# Patient Record
Sex: Female | Born: 1975 | Hispanic: No | Marital: Married | Smoking: Never smoker
Health system: Southern US, Community
[De-identification: ages and names within clinical notes are randomized; demographics above are authoritative.]

## PROBLEM LIST (undated history)

## (undated) DIAGNOSIS — K219 Gastro-esophageal reflux disease without esophagitis: Secondary | ICD-10-CM

## (undated) HISTORY — DX: Gastro-esophageal reflux disease without esophagitis: K21.9

---

## 2004-06-01 ENCOUNTER — Ambulatory Visit: Payer: Self-pay | Admitting: Family Medicine

## 2004-06-09 ENCOUNTER — Ambulatory Visit: Payer: Self-pay | Admitting: Family Medicine

## 2004-06-25 ENCOUNTER — Encounter (INDEPENDENT_AMBULATORY_CARE_PROVIDER_SITE_OTHER): Payer: Self-pay | Admitting: *Deleted

## 2004-06-25 LAB — CONVERTED CEMR LAB

## 2004-07-06 ENCOUNTER — Ambulatory Visit: Payer: Self-pay | Admitting: Family Medicine

## 2004-08-31 ENCOUNTER — Ambulatory Visit: Payer: Self-pay | Admitting: Family Medicine

## 2004-09-21 ENCOUNTER — Ambulatory Visit: Payer: Self-pay | Admitting: Family Medicine

## 2004-10-04 ENCOUNTER — Ambulatory Visit: Payer: Self-pay | Admitting: Sports Medicine

## 2004-10-27 ENCOUNTER — Ambulatory Visit: Payer: Self-pay | Admitting: Family Medicine

## 2004-11-16 ENCOUNTER — Ambulatory Visit: Payer: Self-pay | Admitting: Family Medicine

## 2004-11-23 ENCOUNTER — Ambulatory Visit: Payer: Self-pay | Admitting: Family Medicine

## 2004-12-06 ENCOUNTER — Ambulatory Visit: Payer: Self-pay | Admitting: Obstetrics and Gynecology

## 2004-12-06 ENCOUNTER — Inpatient Hospital Stay (HOSPITAL_COMMUNITY): Admission: AD | Admit: 2004-12-06 | Discharge: 2004-12-06 | Payer: Self-pay | Admitting: *Deleted

## 2004-12-07 ENCOUNTER — Inpatient Hospital Stay (HOSPITAL_COMMUNITY): Admission: AD | Admit: 2004-12-07 | Discharge: 2004-12-11 | Payer: Self-pay | Admitting: *Deleted

## 2004-12-07 ENCOUNTER — Ambulatory Visit: Payer: Self-pay | Admitting: Family Medicine

## 2004-12-07 ENCOUNTER — Ambulatory Visit: Payer: Self-pay | Admitting: Obstetrics and Gynecology

## 2004-12-08 ENCOUNTER — Encounter (INDEPENDENT_AMBULATORY_CARE_PROVIDER_SITE_OTHER): Payer: Self-pay | Admitting: *Deleted

## 2006-08-18 ENCOUNTER — Encounter (INDEPENDENT_AMBULATORY_CARE_PROVIDER_SITE_OTHER): Payer: Self-pay | Admitting: *Deleted

## 2008-12-18 ENCOUNTER — Encounter: Payer: Self-pay | Admitting: Family Medicine

## 2008-12-18 ENCOUNTER — Ambulatory Visit: Payer: Self-pay | Admitting: Family Medicine

## 2008-12-24 ENCOUNTER — Encounter: Payer: Self-pay | Admitting: Family Medicine

## 2008-12-24 DIAGNOSIS — O360990 Maternal care for other rhesus isoimmunization, unspecified trimester, not applicable or unspecified: Secondary | ICD-10-CM | POA: Insufficient documentation

## 2008-12-24 LAB — CONVERTED CEMR LAB
Antibody Screen: NEGATIVE
Basophils Absolute: 0 10*3/uL (ref 0.0–0.1)
Basophils Relative: 0 % (ref 0–1)
Eosinophils Absolute: 0.1 10*3/uL (ref 0.0–0.7)
Eosinophils Relative: 1 % (ref 0–5)
Hemoglobin: 13 g/dL (ref 12.0–15.0)
MCV: 89.8 fL (ref 78.0–100.0)
Monocytes Absolute: 0.4 10*3/uL (ref 0.1–1.0)
Neutrophils Relative %: 70 % (ref 43–77)
Platelets: 232 10*3/uL (ref 150–400)
RBC: 4.21 M/uL (ref 3.87–5.11)
Rubella: 109.7 intl units/mL — ABNORMAL HIGH
WBC: 8.9 10*3/uL (ref 4.0–10.5)

## 2008-12-25 ENCOUNTER — Encounter: Payer: Self-pay | Admitting: Family Medicine

## 2008-12-25 ENCOUNTER — Ambulatory Visit: Payer: Self-pay | Admitting: Family Medicine

## 2008-12-25 LAB — CONVERTED CEMR LAB
Chlamydia, DNA Probe: NEGATIVE
GC Probe Amp, Genital: NEGATIVE

## 2009-01-01 ENCOUNTER — Encounter: Payer: Self-pay | Admitting: Family Medicine

## 2009-01-01 DIAGNOSIS — O34219 Maternal care for unspecified type scar from previous cesarean delivery: Secondary | ICD-10-CM

## 2009-01-23 ENCOUNTER — Ambulatory Visit: Payer: Self-pay | Admitting: Family Medicine

## 2009-02-03 ENCOUNTER — Ambulatory Visit: Payer: Self-pay | Admitting: Family Medicine

## 2009-02-05 ENCOUNTER — Encounter: Payer: Self-pay | Admitting: Family Medicine

## 2009-02-26 ENCOUNTER — Ambulatory Visit: Payer: Self-pay | Admitting: Family Medicine

## 2009-03-03 ENCOUNTER — Encounter: Payer: Self-pay | Admitting: Family Medicine

## 2009-03-17 ENCOUNTER — Telehealth: Payer: Self-pay | Admitting: Family Medicine

## 2009-03-26 ENCOUNTER — Encounter: Payer: Self-pay | Admitting: Family Medicine

## 2009-03-26 ENCOUNTER — Ambulatory Visit: Payer: Self-pay | Admitting: Family Medicine

## 2009-03-26 DIAGNOSIS — Z87898 Personal history of other specified conditions: Secondary | ICD-10-CM

## 2009-05-01 ENCOUNTER — Encounter: Payer: Self-pay | Admitting: Family Medicine

## 2009-05-01 ENCOUNTER — Ambulatory Visit: Payer: Self-pay | Admitting: Family Medicine

## 2009-05-01 DIAGNOSIS — J309 Allergic rhinitis, unspecified: Secondary | ICD-10-CM | POA: Insufficient documentation

## 2009-05-01 LAB — CONVERTED CEMR LAB
Antibody Screen: NEGATIVE
HCT: 31.9 % — ABNORMAL LOW (ref 36.0–46.0)
Hemoglobin: 10.4 g/dL — ABNORMAL LOW (ref 12.0–15.0)
MCHC: 32.6 g/dL (ref 30.0–36.0)
MCV: 91.4 fL (ref 78.0–100.0)
Platelets: 238 10*3/uL (ref 150–400)
RBC: 3.49 M/uL — ABNORMAL LOW (ref 3.87–5.11)
RDW: 13.5 % (ref 11.5–15.5)
WBC: 9.4 10*3/uL (ref 4.0–10.5)

## 2009-05-12 ENCOUNTER — Ambulatory Visit: Payer: Self-pay | Admitting: Family Medicine

## 2009-05-18 ENCOUNTER — Ambulatory Visit: Payer: Self-pay | Admitting: Family Medicine

## 2009-05-18 ENCOUNTER — Encounter: Payer: Self-pay | Admitting: Family Medicine

## 2009-05-18 DIAGNOSIS — K644 Residual hemorrhoidal skin tags: Secondary | ICD-10-CM | POA: Insufficient documentation

## 2009-05-26 ENCOUNTER — Telehealth: Payer: Self-pay | Admitting: *Deleted

## 2009-05-26 ENCOUNTER — Ambulatory Visit: Payer: Self-pay | Admitting: Family Medicine

## 2009-06-01 ENCOUNTER — Encounter: Payer: Self-pay | Admitting: Family Medicine

## 2009-06-01 ENCOUNTER — Ambulatory Visit (HOSPITAL_COMMUNITY): Admission: RE | Admit: 2009-06-01 | Discharge: 2009-06-01 | Payer: Self-pay | Admitting: *Deleted

## 2009-06-10 ENCOUNTER — Ambulatory Visit: Payer: Self-pay | Admitting: Family Medicine

## 2009-06-18 ENCOUNTER — Encounter: Payer: Self-pay | Admitting: Family Medicine

## 2009-06-22 ENCOUNTER — Encounter: Payer: Self-pay | Admitting: Family Medicine

## 2009-06-24 ENCOUNTER — Ambulatory Visit: Payer: Self-pay | Admitting: Family Medicine

## 2009-06-24 ENCOUNTER — Encounter: Payer: Self-pay | Admitting: Family Medicine

## 2009-06-25 ENCOUNTER — Encounter: Payer: Self-pay | Admitting: Family Medicine

## 2009-06-26 ENCOUNTER — Inpatient Hospital Stay (HOSPITAL_COMMUNITY): Admission: AD | Admit: 2009-06-26 | Discharge: 2009-06-26 | Payer: Self-pay | Admitting: Obstetrics & Gynecology

## 2009-06-26 ENCOUNTER — Ambulatory Visit: Payer: Self-pay | Admitting: Obstetrics and Gynecology

## 2009-06-30 ENCOUNTER — Encounter: Payer: Self-pay | Admitting: Family Medicine

## 2009-07-01 ENCOUNTER — Ambulatory Visit (HOSPITAL_COMMUNITY): Admission: RE | Admit: 2009-07-01 | Discharge: 2009-07-01 | Payer: Self-pay | Admitting: Family Medicine

## 2009-07-01 ENCOUNTER — Encounter: Payer: Self-pay | Admitting: Family Medicine

## 2009-07-01 ENCOUNTER — Ambulatory Visit: Payer: Self-pay | Admitting: Family Medicine

## 2009-07-03 ENCOUNTER — Ambulatory Visit: Payer: Self-pay | Admitting: Family Medicine

## 2009-07-03 ENCOUNTER — Inpatient Hospital Stay (HOSPITAL_COMMUNITY): Admission: AD | Admit: 2009-07-03 | Discharge: 2009-07-07 | Payer: Self-pay | Admitting: Obstetrics and Gynecology

## 2009-07-04 ENCOUNTER — Encounter: Payer: Self-pay | Admitting: Family Medicine

## 2009-07-04 ENCOUNTER — Encounter: Payer: Self-pay | Admitting: Obstetrics and Gynecology

## 2009-07-06 ENCOUNTER — Encounter: Payer: Self-pay | Admitting: *Deleted

## 2009-07-09 ENCOUNTER — Encounter: Payer: Self-pay | Admitting: Family Medicine

## 2009-07-09 ENCOUNTER — Emergency Department (HOSPITAL_COMMUNITY): Admission: EM | Admit: 2009-07-09 | Discharge: 2009-07-09 | Payer: Self-pay | Admitting: Family Medicine

## 2010-07-20 NOTE — Assessment & Plan Note (Signed)
Summary: OB AND 1 HR GLUC/KH   Vital Signs:  Patient profile:   35 year old female Weight:      165 pounds BP sitting:   102 / 64  Vitals Entered By: [name] CMA, (May 01, 2009 2:08 PM)  Habits & Providers  Alcohol-Tobacco-Diet     Cigarette Packs/Day: n/a  Current Medications (verified): 1)  Prenatal Vitamins 0.8 Mg Tabs (Prenatal Multivit-Min-Fe-Fa) .Marland Kitchen.. 1 Tab By Mouth Daily.  Take At Night With Small Snack If They Upset Your Stomach. 2)  Acyclovir 400 Mg Tabs (Acyclovir) .Marland Kitchen.. 1 Tab Three Times A Day For 7 Days At First Sign of An Outbreak 3)  Rhinocort Aqua 32 Mcg/act Susp (Budesonide (Nasal)) .... One Spray in Each Nostril Twice Daily  Allergies (verified): No Known Drug Allergies   Impression & Recommendations:  Problem # 1:  SUPERVISION OF OTHER NORMAL PREGNANCY (ICD-V22.1) Assessment Unchanged Doing well today. 28 week labs drawn, including recheck of antibody screen prior to Rhogam injection. 1 hour glucola today = 133. Will defer decision for 3 hour GTT to PCP since this is so close to the cut-off of 135. She has no known risk factors for GDM. The patient has questions about a VBAC today. It seems to be very important to her to have a TOLAC, so we reviewed the need for Acyclovir at 35 weeks to lessen the chance of a herpes lesion. Will make appt with OB to okay TOLAC ([phone]). We discussed the patient's last Korea, showing a low lying placenta and the recommendation for a follow-up US. Reassured her that low lying placentas usually resolve as the pregnancy progresses, but a follow Korea is indicated. Cost is an issue for the patient. Gave information for [facility] and advised patient to get orange card ASAP so that we can send her to the [facility] for an [place] (will be free with orange card). This will also help her with MAU expenses.   EDC by LMP is 07/18/09 confirmed with 20 week Korea. US showing low lying placenta that needs follow up. Labs: A, Rh negative, antibody negative,  Hgb 10.4 (11/12), platelets 238 (11/12), RPR NR, HIV NR, Hep B negative, SS negative, Rubella Immune, Varicella Immune, GC/Chlamydia negative (7/8), PAP Negative, Urine Culture insignificant growth, Quad WNL, 1 hour glucola = 133. Baby = Female = Amar Back-up: Dr. Earlene Plater [phone])  Orders: Glucose 1 hr-[facility] [phone]) CBC-[facility] (82956) RPR-[facility] [id number]) HIV-[facility] ([id number]) Miscellaneous Lab Charge-[facility] (32440) Other OB visit- [facility] (OBCK)  Problem # 2:  RH FACTOR, NEGATIVE (ICD-656.10) Assessment: Unchanged RH antibody rechecked and Rhogam given.  Orders: Rhogam 300 mcg - IM ([id number]) Other OB visit- [facility] (OBCK)  Problem # 3:  GENITAL HERPES, HX OF (ICD-V13.8) Assessment: Unchanged  Has noticed a couple of lesions since her last visit, but has none today. Did not use the previously prescribed Acyclovir. Reviewed need for daily Acyclovir starting at 35 weeks to reduce the chance of having active lesions at time of delivery, which would necessitate a c-section.  Orders: Other OB visit- [facility] (OBCK)  Problem # 4:  PREVIOUS C-SECT DELIVERY ANTPRTM COND/COMP (NUU-725.36)  Will schedule appt at [facility] with OBs for VBAC approval. Prior LTCS due to FTP.  Orders: Other OB visit- [facility] (OBCK)  Problem # 5:  ALLERGIC RHINITIS (ICD-477.9) Assessment: New  The patient endorsed symptoms of allergic rhinitis prior to pregnancy, that has worsened with pregnancy. Likely component of pregnancy rhinitis, but will treat with intranasal glucocorticoid for allergy component. Rx Budesonise since  it is category B. Also recommended regular appropriate exercise, saline spray nasal irrigation. Note: patient endorses trouble affording medications, so gave information for the MAP at the HD. Her updated medication list for this problem includes:    Rhinocort Aqua 32 Mcg/act Susp (Budesonide (nasal)) ..... One spray in each nostril twice daily  Orders: Other OB visit- [facility] (OBCK)  Complete Medication List: 1)   Prenatal Vitamins 0.8 Mg Tabs (Prenatal multivit-min-fe-fa) .Marland Kitchen.. 1 tab by mouth daily.  take at night with small snack if they upset your stomach. 2)  Acyclovir 400 Mg Tabs (Acyclovir) .Marland Kitchen.. 1 tab three times a day for 7 days at first sign of an outbreak 3)  Rhinocort Aqua 32 Mcg/act Susp (Budesonide (nasal)) .... One spray in each nostril twice daily  Patient Instructions: 1)  It was very nice to meet you today! Follow up in 2 weeks with either Dr. Georgiana Shore or Dr. Earlene Plater. 2)  I have prescribed a medicine for your allergies. It may be expensive.  3)  I have given you information for the MEDICATION ASSISTANCE PROGRAM at the [facility] and [place] at [facility]. These will help you to pay for medications and tests.  4)  If you have contractions, bleeding or fluid from your vagina, or you do not feel the baby moving enough, please go to the [facility]. 5)  To feel the baby, sit in a quiet place with your feet up. If you feel the baby kick 5 times in 1 hour, you can stop. If not, feel for another hour. 10 kicks in 2 hours is enough. Do this once a day. 6)  We will schedule an appointment for you to talk about having a vaginal delivery. 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  Father of her first child.  Menstrual History    LMP (date): 10/11/2008    LMP -  Character: normal    Menarche: 14 years    Menses interval: 30 days    Menstrual flow 4 days    On BCP's at conception: no   Flowsheet View for Follow-up Visit    Estimated weeks of       gestation:     28 6/7    Weight:     165    Blood pressure:   102 / 64    Headache:     No    Nausea/vomiting:   No    Edema:     0    Vaginal bleeding:   no    Vaginal discharge:   no    Fundal height:      28    FHR:       150    Fetal activity:  yes    Labor symptoms:   no    Taking prenatal vits?   Y    Smoking:     n/a    Next visit:     2 wk    Resident:     Earlene Plater    Comment:     28 week labs, one hour glucola today. rhogam today. dx allergic rhinitis, claritin not helping.     Medication Administration  Injection # 1:    Medication: Rhogam 300 mcg - IM    Diagnosis: RH FACTOR, NEGATIVE (ICD-656.10)    Route: IM    Site: RUOQ gluteus    Exp Date: 10/23/2011    Lot #: [id number]    Mfr: csl behring    Patient tolerated injection without complications    Given by: [name] CMA, (May 01, 2009 3:43 PM)  Orders Added: 1)  Glucose 1 hr-[facility] [82950] 2)  CBC-[facility] [85027] 3)  RPR-[facility] [[id number]] 4)  HIV-[facility] [[id number]] 5)  Miscellaneous Lab Charge-[facility] [99999] 6)  Rhogam 300 mcg - IM [J2790] 7)  Other OB visit- [facility] [OBCK]   Flowsheet View for Follow-up Visit    Estimated weeks of       gestation:     28 6/7    Weight:     165    Blood pressure:   102 / 64    Hx headache?     No    Nausea/vomiting?   No    Edema?     0    Bleeding?     no    Leakage/discharge?   no    Fetal activity:       yes    Labor symptoms?   no    Fundal height:      28    FHR:       150    Taking Vitamins?   Y    Smoking PPD:   n/a    Comment:     28 week labs, one hour glucola today. rhogam today. dx allergic rhinitis, claritin not helping.     Next visit:     2 wk    Resident:     Earlene Plater

## 2010-07-20 NOTE — Assessment & Plan Note (Signed)
Summary: OB visit/eo   Vital Signs:  Patient profile:   35 year old female Height:      60.6 inches Weight:      168 pounds Pulse rate:   88 / minute BP sitting:   110 / 60  (left arm) Cuff size:   regular  Vitals Entered By: Tessie Fass, CMA CC: OB visit   CC:  OB visit.  Allergies: No Known Drug Allergies   Impression & Recommendations:  Problem # 1:  SUPERVISION OF OTHER NORMAL PREGNANCY (ICD-V22.1) Assessment Unchanged Doing well.  GBS obtained today.  Minimal amount of brown, mucusy discharge on vaginal check.  See pt instructions.  Return in 1 week.  Orders: Grp B Probe-[facility] ([id number]) Other OB visit- [facility] (OBCK)  Problem # 2:  RH FACTOR, NEGATIVE (ICD-656.10) Assessment: Unchanged Rhogam at delivery or at 40 wGA if undelivered.   Problem # 3:  GENITAL HERPES, HX OF (ICD-V13.8) Assessment: Unchanged On acyclovir prophylaxis.   Problem # 4:  PREVIOUS C-SECT DELIVERY ANTPRTM COND/COMP (UJW-119.14) Assessment: Unchanged Has appt with [facility] tomorrow to discuss VBAC.  Appt is later than usual due to 1) awaited ultrasound results to eval low-lying placenta nad had to wait until [facility] approve so patient could afford it.   Complete Medication List: 1)  Prenatal Vitamins 0.8 Mg Tabs (Prenatal multivit-min-fe-fa) .Marland Kitchen.. 1 tab by mouth daily.  take at night with small snack if they upset your stomach. 2)  Acyclovir 400 Mg Tabs (Acyclovir) .Marland Kitchen.. 1 tab three times a day until delivery of baby 3)  Rhinocort Aqua 32 Mcg/act Susp (Budesonide (nasal)) .... One spray in each nostril twice daily 4)  Anusol-hc 2.5 % Crea (Hydrocortisone) .... Apply to affected area three times a day as needed pain. dispense for 1 month 5)  Colace 100 Mg Caps (Docusate sodium) .Marland Kitchen.. 1 tab by mouth two times a day for  Patient Instructions: 1)  If you experience bright red bleeding or the baby isn't moving much, please go right to [facility]. 2)  If you start having contractions that last  about a minute, are coming every 5 minutes, for an hour or more, then go to [facility] hospital for evaluation. 3)  From here on out you will be seen every week.  Please schedule your next appointment with DR. Earlene Plater.  If her schedule is full, then come back to see me.  Try to see Dr. Earlene Plater again soon though as she will deliver you if I am unable to.    Flowsheet View for Follow-up Visit    Estimated weeks of       gestation:     36 4/7    Weight:     168    Blood pressure:   110 / 60    Hx headache?     No    Nausea/vomiting?   No    Edema?     0    Bleeding?     yes    Leakage/discharge?   no    Fetal activity:       yes    Labor symptoms?   no    Fundal height:      36.5    FHR:       150    Fetal position:      vertex    Cx dilation:     1    Cx effacement:   40%    Fetal station:     -2  Taking Vitamins?   Y    Comment:     dark brown spotting on underwear this AM    Next visit:     1 wk    Resident:     Georgiana Shore

## 2010-07-20 NOTE — Miscellaneous (Signed)
Summary: Records faxed to [facility]  Clinical Lists Changes  Faxed 3rd trimester info to [facility].

## 2010-07-20 NOTE — Miscellaneous (Signed)
Summary: TOLAC consultation  Clinical Lists Changes  Received note from [facility] clinics.  Pt had TOLAC consultation with OBs and chooses TOLAC.  Okay with OBs.   Ardeen Garland  MD  June 25, 2009 8:55 PM

## 2010-07-20 NOTE — Miscellaneous (Signed)
Summary: walk in  Clinical Lists Changes came in with baby for his weight check. states she has pain & burning when she urinates. no appt available. sent to UC. she agreed.Golden Circle RN  July 09, 2009 12:16 PM

## 2010-07-20 NOTE — Miscellaneous (Signed)
Summary: FMLA  pts husband dropped off FMLA paperwork to be completed for his job so that he may care for his wife when she delivers their son. Placed in triage for any clinical info to be completed. Knox Royalty  June 22, 2009 9:14 AM    to pcp chart box.Golden Circle RN  June 22, 2009 4:06 PM  completed.  placed in to be faxed box.  Patient also had appt today so gave her copy. Ardeen Garland  MD  June 24, 2009 10:52 AM

## 2010-07-20 NOTE — Letter (Signed)
Summary: FMLA  FMLA   Imported By: De Nurse 06/30/2009 14:03:26  _____________________________________________________________________  External Attachment:    Type:   Image     Comment:   External Document

## 2010-07-20 NOTE — Miscellaneous (Signed)
Summary: ERROR/DNKA/TS  Clinical Lists Changes

## 2010-07-20 NOTE — Miscellaneous (Signed)
Summary: Rhogam  Rhogam   Imported By: Clydell Hakim 07/02/2009 11:48:53  _____________________________________________________________________  External Attachment:    Type:   Image     Comment:   External Document

## 2010-07-20 NOTE — Assessment & Plan Note (Signed)
Summary: ob visit/eo   Vital Signs:  Patient profile:   35 year old female Weight:      166.5 pounds BP sitting:   109 / 70  Primary Care Provider:  Ardeen Garland  MD   History of Present Illness: Patient comes in today for routine OB at 37.4 WGA.  At last visit had dark brown discharge.  Friday developed 1 episode of bright red vaginal bleeding that soaked through her underwear and her pants.  She went to [facility] for evaluation.  Records reviewed in EMSTAT.  Was monitored on FHR monitor and toco.  No ultrasound done.  Vaginal exam showed dark brown blood/discharge but not bright red bleeding.  Patient also with h/o hemorrhoid but patient feels confident bleeding was vaginal.  No further bleeding since.  She is Rh negative and was not given rhogam in the MAU.  Still having occassional dark brown discharge.  Patient worried about the bleeding.  H/O low-lying placenta, last ultrasound showed placental edge 2.4 cm above os.  Was given okay for TOLAC at [facility] consultation at [facility] hospital. Bleeding friday was not associated with any pain.    Habits & Providers  Alcohol-Tobacco-Diet     Cigarette Packs/Day: n/a  Allergies: No Known Drug Allergies   Impression & Recommendations:  Problem # 1:  SUPERVISION OF OTHER NORMAL PREGNANCY (ICD-V22.1) Assessment Unchanged  Appropriate growth.  Now term.  See following issues below.   Orders: Other OB visit- [facility] (OBCK)  Problem # 2:  VAGINAL BLEEDING IN PREGNANCY (ICD-641.90) Assessment: New Will obtain ultrasound today given bleeding and h/o low-lying placenta (but NOT previa).  Was not given rhogam at [facility] on Friday.  Will draw Rh antibody today and give rhogam here today as well.  There is some possibility this could have been rectal, but does have dark brown discharge/old blood during cervical check today.  Orders: Miscellaneous Lab Charge-[facility] [phone]) Prenatal U/S > 14 weeks - [id number] (Prenatal U/S) Other OB visit- [facility]  (OBCK)  Problem # 3:  GENITAL HERPES, HX OF (ICD-V13.8) Assessment: Unchanged  On acyclovir prophylaxis.  No symptoms at this time.   Orders: Other OB visit- [facility] (OBCK)  Problem # 4:  RH FACTOR, NEGATIVE (ICD-656.10) Assessment: Unchanged  Giving rhogam today due to vaginal bleeding.   Orders: Other OB visit- [facility] (OBCK)  Problem # 5:  PREVIOUS C-SECT DELIVERY ANTPRTM COND/COMP (OVF-643.32) Assessment: Unchanged  Appro[facility]AC [facility]   Orders: Other OB visit- [facility] (OBCK)  Complete Medication List: 1)  Prenatal Vitamins 0.8 Mg Tabs (Prenatal multivit-min-fe-fa) .Marland Kitchen.. 1 tab by mouth daily.  take at night with small snack if they upset your stomach. 2)  Acyclovir 400 Mg Tabs (Acyclovir) .Marland Kitchen.. 1 tab three times a day until delivery of baby 3)  Rhinocort Aqua 32 Mcg/act Susp (Budesonide (nasal)) .... One spray in each nostril twice daily 4)  Anusol-hc 2.5 % Crea (Hydrocortisone) .... Apply to affected area three times a day as needed pain. dispense for 1 month 5)  Colace 100 Mg Caps (Docusate sodium) .Marland Kitchen.. 1 tab by mouth two times a day for  Patient Instructions: 1)  Please keep your appointment with Dr. Earlene Plater as scheduled. 2)  We are scheduling your ultrasound to make sure everything is okay with the baby.  If you bleed bright red blood again, you should still go to [facility] for evaluation. 3)  Continue the acyclovir.    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  Father of her first child.  Menstrual History    LMP (date): 10/11/2008    LMP - Character: normal    Menarche: 14 years    Menses  interval: 30 days    Menstrual flow 4 days    On BCP's at conception: no   Flowsheet View for Follow-up Visit    Estimated weeks of       gestation:     37 4/7    Weight:     166.5    Blood pressure:   109 / 70    Headache:     No    Nausea/vomiting:   No    Edema:     0    Vaginal bleeding:   yes    Vaginal discharge:   d/c    Fundal height:      38    Fetal activity:     yes    Labor symptoms:   few ctx    Fetal position:     vertex    Cx Dilation:     1    Cx Effacement:   40%    Cx Station:     -2    Taking prenatal vits?   Y    Smoking:     n/a    Next visit:     1 wk    Resident:     Carrianne Hyun    Preceptor:     Mauricio Po    Comment:     occassional crampy contractions.  Vaginal bleeding as per HPI    Flowsheet View for Follow-up Visit    Estimated weeks of       gestation:     37 4/7    Weight:     166.5    Blood pressure:   109 / 70    Hx headache?     No    Nausea/vomiting?   No    Edema?     0    Bleeding?     yes    Leakage/discharge?   d/c    Fetal activity:       yes    Labor symptoms?   few ctx    Fundal height:      38    Fetal position:      vertex    Cx dilation:     1    Cx effacement:   40%    Fetal station:     -2    Taking Vitamins?   Y    Smoking PPD:   n/a    Comment:     occassional crampy contractions.  Vaginal bleeding as per HPI    Next visit:     1 wk    Resident:     Reeya Bound    Preceptor:     Mauricio Po  Appended Document: ob visit/eo   Medication Administration  Injection # 1:    Medication: Rhogam 300 mcg - IM    Diagnosis: VAGINAL BLEEDING IN PREGNANCY (ICD-641.90)    Route: IM    Site: RUOQ gluteus  Exp Date: 02/18/2011    Lot #: [id number]    Mfr: CSL BEHRING AG    Comments: GIVEN PER MD ORDER.  CARD GIVEN TO PT    Given by: Starleen Blue RN (July 01, 2009 10:41 AM)  Orders Added: 1)  Rhogam 300 mcg - IM [[id number]]

## 2010-09-05 LAB — CBC
HCT: 34.2 % — ABNORMAL LOW (ref 36.0–46.0)
MCHC: 33.5 g/dL (ref 30.0–36.0)
MCV: 94.1 fL (ref 78.0–100.0)
Platelets: 208 10*3/uL (ref 150–400)
Platelets: 198 10*3/uL (ref 150–400)
RBC: 3.63 MIL/uL — ABNORMAL LOW (ref 3.87–5.11)
RDW: 14.7 % (ref 11.5–15.5)
RDW: 14.8 % (ref 11.5–15.5)
WBC: 10.6 10*3/uL — ABNORMAL HIGH (ref 4.0–10.5)

## 2010-09-05 LAB — TYPE AND SCREEN: DAT, IgG: NEGATIVE

## 2010-09-05 LAB — DIC (DISSEMINATED INTRAVASCULAR COAGULATION)PANEL
D-Dimer, Quant: 2.72 ug/mL-FEU — ABNORMAL HIGH (ref 0.00–0.48)
Fibrinogen: 473 mg/dL (ref 204–475)
INR: 0.98 (ref 0.00–1.49)
Platelets: 185 10*3/uL (ref 150–400)
Smear Review: NONE SEEN
aPTT: 28 seconds (ref 24–37)

## 2010-09-05 LAB — POCT URINALYSIS DIP (DEVICE)
Bilirubin Urine: NEGATIVE
Glucose, UA: NEGATIVE mg/dL
Ketones, ur: NEGATIVE mg/dL
Protein, ur: NEGATIVE mg/dL

## 2010-09-05 LAB — RH IMMUNE GLOBULIN WORKUP (NOT WOMEN'S HOSP): Antibody Screen: POSITIVE

## 2010-09-22 LAB — GLUCOSE, CAPILLARY: Glucose-Capillary: 133 mg/dL — ABNORMAL HIGH (ref 70–99)

## 2010-11-05 NOTE — Op Note (Signed)
Cynthia Fletcher, TRUBY                 ACCOUNT NO.:  [id number]   MEDICAL RECORD NO.:  [id number]          PATIENT TYPE:  INP   LOCATION:  9138                          FACILITY:  [facility]   PHYSICIAN:  Phil D. Okey Dupre, M.D.     DATE OF BIRTH:  Nov 01, 1975   DATE OF PROCEDURE:  12/08/2004  DATE OF DISCHARGE:                                 OPERATIVE REPORT   PROCEDURE:  Low transverse cesarean section.   PREOPERATIVE DIAGNOSES:  1.  Transverse arrest.  2.  Cephalopelvic disproportion   POSTOPERATIVE DIAGNOSES:  1.  Transverse arrest.  2.  Cephalopelvic disproportion   SURGEON:  Phil D. Okey Dupre, M.D.   ANESTHESIA:  Epidural.   ESTIMATED BLOOD LOSS:  500 mL.   POSTOPERATIVE CONDITION:  Satisfactory.   Infant female, cord pH 7.27.   PROCEDURE:  Under satisfactory epidural anesthesia with the patient in the  dorsal supine position, a Foley catheter in the urinary bladder, the abdomen  was prepped and draped in the usual sterile manner and entered through a  Pfannenstiel incision situated 3 cm above the symphysis pubis and extended  for a total of 16 cm.  The abdomen was entered by layers.  On entering the  peritoneal cavity, the visceral peritoneum on the anterior surface of the  uterus was opened transversely by sharp dissection and the bladder pushed  away from the lower uterine segment, which was entered by sharp and blunt  dissection from LOP presentation.  The baby was easily delivered, the cord  doubly clamped, divided, the baby handed to the pediatrician.  Samples of  blood taken from the cord from analysis, the placenta manually removed, the  uterus explored and closed with continuous running locked 0 Vicryl suture on  an atraumatic needle followed by a second layer of imbricating 0 Vicryl.  The area was observed for bleeding, none was noted.  The pelvis was  irrigated and the fascia closed with continuous running 0 Vicryl on an  atraumatic needle.  Subcutaneous bleeders controlled  with hot cautery, skin  staples were used for skin edge approximation.  Tape, instrument, sponge and  needle count were reported correct at the end of the procedure, and the  Foley catheter was draining clear amber urine and the patient transferred to  recovery in satisfactory condition.     PDR/MEDQ  D:  12/08/2004  T:  12/08/2004  Job:  [id number]

## 2010-11-05 NOTE — Discharge Summary (Signed)
Cynthia Fletcher, Cynthia Fletcher                 ACCOUNT NO.:  [id number]   MEDICAL RECORD NO.:  [id number]          PATIENT TYPE:  INP   LOCATION:  9138                          [facility]ACILITY:  [facility]   PHYSICIAN:  Conni Elliot, M.D.DATE OF BIRTH:  Jun 25, 1975   DATE OF ADMISSION:  12/07/2004  DATE OF DISCHARGE:  12/11/2004                                 DISCHARGE SUMMARY   DISCHARGE DIAGNOSES:  1.  Term birth of a living infant.  2.  Low transverse cesarean section.   BRIEF HISTORY AND HOSPITAL COURSE:  [name] is a 35 year old [place]  female who presented to clinic and then [facility] at 40 weeks 1  day in active labor, status post rupture of membranes.  She failed to  progress in labor even with augmentation with Pitocin.  The infant was  having nonreassuring fetal heart tones, and she was taken on June 22 for a  low transverse C-section, postoperatively did well and had routine care  without complication.  She was discharged on postop day #3, staples removed  in the hospital, for routine follow-up in six weeks with Dr. Larina Bras at [facility].  She was breast-feeding and continued to take prenatal  vitamins for six weeks or until finished breast-feeding and Micronor for  contraception with the plan for an IUD postpartum.      Ace Gins, MD    ______________________________  Conni Elliot, M.D.    JS/MEDQ  D:  03/03/2005  T:  03/03/2005  Job:  [id number]

## 2011-04-07 ENCOUNTER — Other Ambulatory Visit (HOSPITAL_COMMUNITY)
Admission: RE | Admit: 2011-04-07 | Discharge: 2011-04-07 | Disposition: A | Discharge status: Home / Self Care | Payer: Medicaid Other | Source: Ambulatory Visit | Attending: Family Medicine | Admitting: Family Medicine

## 2011-04-07 ENCOUNTER — Ambulatory Visit: Payer: Medicaid Other | Admitting: Family Medicine

## 2011-04-07 ENCOUNTER — Encounter: Payer: Self-pay | Admitting: Family Medicine

## 2011-04-07 VITALS — BP 111/70 | Wt 162.0 lb

## 2011-04-07 DIAGNOSIS — Z331 Pregnant state, incidental: Secondary | ICD-10-CM

## 2011-04-07 DIAGNOSIS — Z01419 Encounter for gynecological examination (general) (routine) without abnormal findings: Secondary | ICD-10-CM | POA: Insufficient documentation

## 2011-04-07 DIAGNOSIS — O09899 Supervision of other high risk pregnancies, unspecified trimester: Secondary | ICD-10-CM

## 2011-04-07 NOTE — Patient Instructions (Signed)
To improve hemmorrhoids: Drink lots of water. Use fiber supplement- like fibercon. If your stool is hard at all- use miralax or colace Goal is to keep stool very soft.   My staff will call you with your ultrasound appointment.--  Return in 1 month for recheck.

## 2011-04-08 ENCOUNTER — Telehealth: Payer: Self-pay | Admitting: Family Medicine

## 2011-04-08 ENCOUNTER — Other Ambulatory Visit: Payer: Self-pay | Admitting: *Deleted

## 2011-04-08 LAB — OBSTETRIC PANEL
Antibody Screen: NEGATIVE
Basophils Absolute: 0 10*3/uL (ref 0.0–0.1)
Eosinophils Absolute: 0.1 10*3/uL (ref 0.0–0.7)
HCT: 33.7 % — ABNORMAL LOW (ref 36.0–46.0)
Hepatitis B Surface Ag: NEGATIVE
MCHC: 32.9 g/dL (ref 30.0–36.0)
MCV: 90.8 fL (ref 78.0–100.0)
Monocytes Relative: 8 % (ref 3–12)
Neutro Abs: 7.1 10*3/uL (ref 1.7–7.7)
RBC: 3.71 MIL/uL — ABNORMAL LOW (ref 3.87–5.11)
RDW: 14.2 % (ref 11.5–15.5)
Rubella: 148 IU/mL — ABNORMAL HIGH
WBC: 9.7 10*3/uL (ref 4.0–10.5)

## 2011-04-08 LAB — GC/CHLAMYDIA PROBE AMP, GENITAL
Chlamydia, DNA Probe: NEGATIVE
GC Probe Amp, Genital: NEGATIVE

## 2011-04-08 NOTE — Telephone Encounter (Signed)
Fwd. To PCP for refill .[name] Pt needs all of her meds refilled please.

## 2011-04-08 NOTE — Telephone Encounter (Signed)
Thought that the doctor was going to call in meds for her.- pharmacy states they don't have anything [facility] [address]

## 2011-04-09 LAB — CULTURE, OB URINE: Organism ID, Bacteria: NO GROWTH

## 2011-04-11 MED ORDER — DOCUSATE SODIUM 100 MG PO CAPS: 100.0000 mg | ORAL_CAPSULE | Freq: Two times a day (BID) | ORAL | Status: DC

## 2011-04-11 MED ORDER — ACYCLOVIR 400 MG PO TABS: 400.0000 mg | ORAL_TABLET | Freq: Three times a day (TID) | ORAL | Status: DC

## 2011-04-11 MED ORDER — BUDESONIDE 32 MCG/ACT NA SUSP: 1.0000 | Freq: Two times a day (BID) | NASAL | Status: DC

## 2011-04-11 NOTE — Telephone Encounter (Signed)
Fwd. To Dr.Caviness. Please refill pt's meds. Lorenda Hatchet, Renato Battles

## 2011-04-11 NOTE — Telephone Encounter (Signed)
Pt called again asking about her meds.  pls let her know

## 2011-04-12 NOTE — Telephone Encounter (Signed)
rx refilled on 04/11/11- see medication list.

## 2011-04-12 NOTE — Telephone Encounter (Signed)
Not all of her medications were sent to [facility], she still needs the Prenatal Vitamins, the Anusol and the Rhinocort.  EPIC show the Rhinocort, but [facility] has told the patient they did not get that one.  Please call her when this is corrected.

## 2011-04-12 NOTE — Telephone Encounter (Signed)
Will forward to Dr Caviness 

## 2011-04-13 ENCOUNTER — Telehealth: Payer: Self-pay | Admitting: *Deleted

## 2011-04-13 NOTE — Telephone Encounter (Signed)
Will forward to Dr. Tye Savoy in Dr. Edmonia James' absence.

## 2011-04-13 NOTE — Telephone Encounter (Signed)
Received call from pharmacy stating Rhinocort is on backorder. Want to know if this Rx can be changed to something else. Will forward message to Dr. Edmonia James.

## 2011-04-14 ENCOUNTER — Telehealth: Payer: Self-pay | Admitting: Family Medicine

## 2011-04-14 ENCOUNTER — Telehealth: Payer: Self-pay | Admitting: *Deleted

## 2011-04-14 ENCOUNTER — Ambulatory Visit (HOSPITAL_COMMUNITY)
Admission: RE | Admit: 2011-04-14 | Discharge: 2011-04-14 | Disposition: A | Discharge status: Home / Self Care | Payer: Medicaid Other | Source: Ambulatory Visit | Attending: Family Medicine | Admitting: Family Medicine

## 2011-04-14 ENCOUNTER — Other Ambulatory Visit: Payer: Self-pay | Admitting: Family Medicine

## 2011-04-14 ENCOUNTER — Encounter: Payer: Self-pay | Admitting: Family Medicine

## 2011-04-14 DIAGNOSIS — Z363 Encounter for antenatal screening for malformations: Secondary | ICD-10-CM | POA: Insufficient documentation

## 2011-04-14 DIAGNOSIS — O358XX0 Maternal care for other (suspected) fetal abnormality and damage, not applicable or unspecified: Secondary | ICD-10-CM | POA: Insufficient documentation

## 2011-04-14 DIAGNOSIS — O34219 Maternal care for unspecified type scar from previous cesarean delivery: Secondary | ICD-10-CM | POA: Insufficient documentation

## 2011-04-14 DIAGNOSIS — Z1389 Encounter for screening for other disorder: Secondary | ICD-10-CM | POA: Insufficient documentation

## 2011-04-14 DIAGNOSIS — Z331 Pregnant state, incidental: Secondary | ICD-10-CM

## 2011-04-14 MED ORDER — PRENATAL VITAMINS 0.8 MG PO TABS: 1.0000 | ORAL_TABLET | Freq: Every day | ORAL | Status: AC

## 2011-04-14 MED ORDER — FLUTICASONE PROPIONATE 50 MCG/ACT NA SUSP: 1.0000 | Freq: Two times a day (BID) | NASAL | Status: DC

## 2011-04-14 MED ORDER — HYDROCORTISONE 2.5 % RE CREA: TOPICAL_CREAM | Freq: Three times a day (TID) | RECTAL | Status: AC | PRN

## 2011-04-14 MED ORDER — BUDESONIDE 32 MCG/ACT NA SUSP: 1.0000 | Freq: Two times a day (BID) | NASAL | Status: DC

## 2011-04-14 NOTE — Telephone Encounter (Signed)
Cynthia Fletcher, thanks for taking care of this.

## 2011-04-14 NOTE — Progress Notes (Deleted)
S: no complaints or concers at today's appointment O: as per above. Pulm: CTA bilateral, CV: RRR, No m/r/g A and P: 1)prenatal labs- ordered today, will be drawn today 2)genital herpes- has approx 6 outbreaks per year, none currently, refilled acyclovir. 3)

## 2011-04-14 NOTE — Telephone Encounter (Signed)
Pharmacy calling to let us know Rhinocort is not available.  Wanting to know if we want to change it to something else.  Advised pharmacy to change to Flonase, one spray each nostril twice a day.  Will forward to Dr. Edmonia James to make sure she agrees with this plan.  Ileana Ladd

## 2011-04-14 NOTE — Progress Notes (Signed)
Pt states that she received rhogam shot during her last pregancy- antibody test pending.

## 2011-04-14 NOTE — Progress Notes (Signed)
Pt refused flu vaccine today.

## 2011-04-14 NOTE — Telephone Encounter (Signed)
Called pt. She only received the colace and acyclovir (04/11/11). Called in Rhinocort to pt's pharmacy. Lorenda Hatchet, Renato Battles
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
## 2011-04-14 NOTE — Telephone Encounter (Signed)
Cynthia Fletcher is calling because her allergy medicine did not make it to the pharmacy for some reason.  She would like this taken care of today and would like a call when it is done.

## 2011-04-14 NOTE — Telephone Encounter (Signed)
rx sent again for rhinocort, didn't send prenatal vitamin and anusol rx to pharmacy b/c OTC, but since pt perfers this will send rx to pharmacy for these meds.

## 2011-04-18 DIAGNOSIS — O09899 Supervision of other high risk pregnancies, unspecified trimester: Secondary | ICD-10-CM | POA: Insufficient documentation

## 2011-04-18 NOTE — Progress Notes (Signed)
Note reviewed.  Agree with plan.   RH negative - will need Rhogam at 28 weeks. Varicella non immune - needs post partum vaccination H/o c-section x 2 - will need repeat H/o HSV - has acyclovir for outbreaks.  Will also need suppressive therapy at 36 weeks.

## 2011-04-20 ENCOUNTER — Encounter: Payer: Self-pay | Admitting: Family Medicine

## 2011-05-10 ENCOUNTER — Ambulatory Visit: Payer: Medicaid Other | Admitting: Family Medicine

## 2011-05-10 VITALS — BP 103/67 | Wt 168.0 lb

## 2011-05-10 DIAGNOSIS — Z331 Pregnant state, incidental: Secondary | ICD-10-CM

## 2011-05-10 DIAGNOSIS — Z3490 Encounter for supervision of normal pregnancy, unspecified, unspecified trimester: Secondary | ICD-10-CM | POA: Insufficient documentation

## 2011-05-10 LAB — GLUCOSE, CAPILLARY: Glucose-Capillary: 127 mg/dL — ABNORMAL HIGH (ref 70–99)

## 2011-05-10 MED ORDER — PANTOPRAZOLE SODIUM 20 MG PO TBEC: 20.0000 mg | DELAYED_RELEASE_TABLET | Freq: Every day | ORAL | Status: AC

## 2011-05-10 MED ORDER — RHO D IMMUNE GLOBULIN 1500 UNIT/2ML IJ SOLN: 300.0000 ug | Freq: Once | INTRAMUSCULAR | Status: AC

## 2011-05-10 MED ORDER — ZOLPIDEM TARTRATE ER 6.25 MG PO TBCR: 6.2500 mg | EXTENDED_RELEASE_TABLET | Freq: Every evening | ORAL | Status: AC | PRN
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
## 2011-05-10 NOTE — Progress Notes (Signed)
Varicella screen- negative- will need vaccination postpartum.  Infant name- Janat.

## 2011-05-10 NOTE — Patient Instructions (Addendum)
For reflux: protonix daily, tums as needed  For sleep: Review sleep hygiene sheet, exercise daily, ambien only if absolutely necessary  Return in 2 weeks to be seen in the [facility] clinic.

## 2011-05-16 ENCOUNTER — Telehealth: Payer: Self-pay | Admitting: *Deleted

## 2011-05-16 NOTE — Telephone Encounter (Signed)
PA required for Zolpidem ER 6.25 mg tab. Form placed in MD box.

## 2011-05-18 NOTE — Telephone Encounter (Signed)
Approval received from insurance.  Pharmacy notified. 

## 2011-05-26 ENCOUNTER — Ambulatory Visit (INDEPENDENT_AMBULATORY_CARE_PROVIDER_SITE_OTHER): Payer: Medicaid Other | Admitting: Family Medicine

## 2011-05-26 VITALS — BP 102/67 | Wt 169.7 lb

## 2011-05-26 DIAGNOSIS — Z98891 History of uterine scar from previous surgery: Secondary | ICD-10-CM

## 2011-05-26 NOTE — Patient Instructions (Signed)
Please make an appointment to be seen in the [facility] clinic with Dr. Mauricio Po or Dr. [place] in 2 weeks.

## 2011-06-03 NOTE — Progress Notes (Signed)
S: pt states she would like [facility] to be the baby's doctor.   Heartburn improved with protonix. Insomnia continues- has not tried [place]. Infant name- Janat- female, plans to breast and bottle feed, plans to have IUD for contraception. O: see flowsheet A and P: pt is a G5P2002 now at 30 weeks: 1)genital herpes- has approx 6 outbreaks per year, none currently, has acyclovir refill at pharmacy in case of outbreak  2) 2 previous c/s-pt to be referred to ob/gyn- order placed. 3) varicella screen negative- will need vaccination postpartum 4)Rh negative- rhogam given at last visit. --will administer rhogam within 72 hours of delivery of an Rh(D)-positive infant  5)return in 2 weeks for follow up with OB clinic. - my office will call you with your OBGYN referral appointment.

## 2011-06-09 ENCOUNTER — Encounter: Payer: Self-pay | Admitting: Family Medicine

## 2011-06-15 ENCOUNTER — Ambulatory Visit (INDEPENDENT_AMBULATORY_CARE_PROVIDER_SITE_OTHER): Payer: Medicaid Other | Admitting: Obstetrics and Gynecology

## 2011-06-15 VITALS — BP 112/68 | HR 100 | Temp 97.1°F | Wt 171.6 lb

## 2011-06-15 DIAGNOSIS — Z3480 Encounter for supervision of other normal pregnancy, unspecified trimester: Secondary | ICD-10-CM

## 2011-06-15 DIAGNOSIS — O34219 Maternal care for unspecified type scar from previous cesarean delivery: Secondary | ICD-10-CM

## 2011-06-15 LAB — POCT URINALYSIS DIP (DEVICE)
Bilirubin Urine: NEGATIVE
Hgb urine dipstick: NEGATIVE
Ketones, ur: NEGATIVE mg/dL
Protein, ur: NEGATIVE mg/dL
pH: 7 (ref 5.0–8.0)

## 2011-06-15 NOTE — Progress Notes (Signed)
Patient sent for delivery consult

## 2011-06-15 NOTE — Progress Notes (Signed)
Referred here from [facility] for consult with obstetrician for delivery plan. Desires repeat (tertiary). Initial pregnancy: maximally dilated 8-9 and had fever, then had ERC/S. Note: 1 hr glucola done at [facility] was entered wrong (POCT) as pt states they drew venous blood. Result was 127. [facility] OB appt tomorrow at [facility] so advised to call and reschedule for 2 wks.  Dr. Jolayne Panther spoke with pt and will schedule RC/S at 39 wks.

## 2011-06-15 NOTE — Progress Notes (Addendum)
Patient sent for delivery consult. Patient with 2 previous cesarean section. Risk benefits and alternatives were explained. Patient desires repeat c-section. Risk, of bleeding, infection and damage to adjacent organs explained. Patient declined BTL and is planning on using IUD for birth control. Patient will be scheduled for repeat c-section at 39 weeks on 07/28/2011. Patient advised to continue routine prenatal care with [facility]

## 2011-06-15 NOTE — Patient Instructions (Signed)
Breastfeeding BENEFITS OF BREASTFEEDING For the baby  The first milk (colostrum) helps the baby's digestive system function better.   There are antibodies from the mother in the milk that help the baby fight off infections.   The baby has a lower incidence of asthma, allergies, and SIDS (sudden infant death syndrome).   The nutrients in breast milk are better than formulas for the baby and helps the baby's brain grow better.   Babies who breastfeed have less gas, colic, and constipation.  For the mother  Breastfeeding helps develop a very special bond between mother and baby.   It is more convenient, always available at the correct temperature and cheaper than formula feeding.   It burns calories in the mother and helps with losing weight that was gained during pregnancy.   It makes the uterus contract back down to normal size faster and slows bleeding following delivery.   Breastfeeding mothers have a lower risk of developing breast cancer.  NURSE FREQUENTLY  A healthy, full-term baby may breastfeed as often as every hour or space his or her feedings to every 3 hours.   How often to nurse will vary from baby to baby. Watch your baby for signs of hunger, not the clock.   Nurse as often as the baby requests, or when you feel the need to reduce the fullness of your breasts.   Awaken the baby if it has been 3 to 4 hours since the last feeding.   Frequent feeding will help the mother make more milk and will prevent problems like sore nipples and engorgement of the breasts.  BABY'S POSITION AT THE BREAST  Whether lying down or sitting, be sure that the baby's tummy is facing your tummy.   Support the breast with 4 fingers underneath the breast and the thumb above. Make sure your fingers are well away from the nipple and baby's mouth.   Stroke the baby's lips and cheek closest to the breast gently with your finger or nipple.   When the baby's mouth is open wide enough, place  all of your nipple and as much of the dark area around the nipple as possible into your baby's mouth.   Pull the baby in close so the tip of the nose and the baby's cheeks touch the breast during the feeding.  FEEDINGS  The length of each feeding varies from baby to baby and from feeding to feeding.   The baby must suck about 2 to 3 minutes for your milk to get to him or her. This is called a "let down." For this reason, allow the baby to feed on each breast as long as he or she wants. Your baby will end the feeding when he or she has received the right balance of nutrients.   To break the suction, put your finger into the corner of the baby's mouth and slide it between his or her gums before removing your breast from his or her mouth. This will help prevent sore nipples.  REDUCING BREAST ENGORGEMENT  In the first week after your baby is born, you may experience signs of breast engorgement. When breasts are engorged, they feel heavy, warm, full, and may be tender to the touch. You can reduce engorgement if you:   Nurse frequently, every 2 to 3 hours. Mothers who breastfeed early and often have fewer problems with engorgement.   Place light ice packs on your breasts between feedings. This reduces swelling. Wrap the ice packs in a   lightweight towel to protect your skin.   Apply moist hot packs to your breast for 5 to 10 minutes before each feeding. This increases circulation and helps the milk flow.   Gently massage your breast before and during the feeding.   Make sure that the baby empties at least one breast at every feeding before switching sides.   Use a breast pump to empty the breasts if your baby is sleepy or not nursing well. You may also want to pump if you are returning to work or or you feel you are getting engorged.   Avoid bottle feeds, pacifiers or supplemental feedings of water or juice in place of breastfeeding.   Be sure the baby is latched on and positioned properly while  breastfeeding.   Prevent fatigue, stress, and anemia.   Wear a supportive bra, avoiding underwire styles.   Eat a balanced diet with enough fluids.  If you follow these suggestions, your engorgement should improve in 24 to 48 hours. If you are still experiencing difficulty, call your lactation consultant or caregiver. IS MY BABY GETTING ENOUGH MILK? Sometimes, mothers worry about whether their babies are getting enough milk. You can be assured that your baby is getting enough milk if:  The baby is actively sucking and you hear swallowing.   The baby nurses at least 8 to 12 times in a 24 hour time period. Nurse your baby until he or she unlatches or falls asleep at the first breast (at least 10 to 20 minutes), then offer the second side.   The baby is wetting 5 to 6 disposable diapers (6 to 8 cloth diapers) in a 24 hour period by 5 to 6 days of age.   The baby is having at least 2 to 3 stools every 24 hours for the first few months. Breast milk is all the food your baby needs. It is not necessary for your baby to have water or formula. In fact, to help your breasts make more milk, it is best not to give your baby supplemental feedings during the early weeks.   The stool should be soft and yellow.   The baby should gain 4 to 7 ounces per week after he is 4 days old.  TAKE CARE OF YOURSELF Take care of your breasts by:  Bathing or showering daily.   Avoiding the use of soaps on your nipples.   Start feedings on your left breast at one feeding and on your right breast at the next feeding.   You will notice an increase in your milk supply 2 to 5 days after delivery. You may feel some discomfort from engorgement, which makes your breasts very firm and often tender. Engorgement "peaks" out within 24 to 48 hours. In the meantime, apply warm moist towels to your breasts for 5 to 10 minutes before feeding. Gentle massage and expression of some milk before feeding will soften your breasts, making  it easier for your baby to latch on. Wear a well fitting nursing bra and air dry your nipples for 10 to 15 minutes after each feeding.   Only use cotton bra pads.   Only use pure lanolin on your nipples after nursing. You do not need to wash it off before nursing.  Take care of yourself by:   Eating well-balanced meals and nutritious snacks.   Drinking milk, fruit juice, and water to satisfy your thirst (about 8 glasses a day).   Getting plenty of rest.   Increasing calcium in   your diet (1200 mg a day).   Avoiding foods that you notice affect the baby in a bad way.  SEEK MEDICAL CARE IF:   You have any questions or difficulty with breastfeeding.   You need help.   You have a hard, red, sore area on your breast, accompanied by a fever of 100.5 F (38.1 C) or more.   Your baby is too sleepy to eat well or is having trouble sleeping.   Your baby is wetting less than 6 diapers per day, by 5 days of age.   Your baby's skin or white part of his or her eyes is more yellow than it was in the hospital.   You feel depressed.  Document Released: 06/06/2005 Document Revised: 02/16/2011 Document Reviewed: 01/19/2009 Exit[employer] Patient Information 2012 [employer]. 

## 2011-06-27 ENCOUNTER — Other Ambulatory Visit: Payer: Self-pay | Admitting: Obstetrics and Gynecology

## 2011-06-28 ENCOUNTER — Ambulatory Visit (INDEPENDENT_AMBULATORY_CARE_PROVIDER_SITE_OTHER): Payer: Medicaid Other | Admitting: Family Medicine

## 2011-06-28 DIAGNOSIS — Z3480 Encounter for supervision of other normal pregnancy, unspecified trimester: Secondary | ICD-10-CM

## 2011-06-28 NOTE — Progress Notes (Signed)
IUD for birth control after delivery.  Pt plans to breast and bottle feed.

## 2011-06-28 NOTE — Progress Notes (Signed)
S: Patient reports palms with worsening hemorrhoids-using over-the-counter creams. Denies constipation. Expresses some concern that baby has had decreased movements in the past couple of weeks. Has not been doing kick counts. Also would like me to check small spot that is dark in color on left breast-have been for 2-3 years-or maybe longer- and has not changed at all. No nipple discharge. No nipple bleeding.  O: as per flowsheet. Skin exam: Small 3 mm diameter hyperpigmented macule. Present on medial lower breast. No redness. No edema. No nodule or thickness to lesion. Borders smooth/no irregularity. Patient states no changes in size or color in multiple years.  A and P: 1) hemorrhoids: Continue over the counter creams as needed. Colace and MiraLAX as needed for any constipation. 2) kick counts: Baby moving on exam today. Heart rate within normal limits. Reviewed kick counts with patient. If not having 10 kicks in one hour,patient is to go to MAU. 3) lesion on breast: Consistent with mole unchanged. We'll monitor closely. No intervention at this time. 4) Genital Herpes: Patient to take antiviral starting at 36 weeks.-patient has refill already at home. 5)Rh negative- rhogam given at last visit. --will administer rhogam within 72 hours of delivery of an Rh(D)-positive infant  6)repeat C-section scheduled for  07/28/2011. 7) patient to return in 2 weeks for followup appointment.

## 2011-06-28 NOTE — Patient Instructions (Signed)
Remember to do kick counts if any concerns about decreased movement.  If any concerns about not enough kicks- go to the MAU at [facility] hospital.   Hemorrhoids: Continue to use the OTC creams for pain relief.  Also, if any constipation or hard stools- use colace or miralax.

## 2011-07-12 ENCOUNTER — Encounter (HOSPITAL_COMMUNITY): Payer: Self-pay | Admitting: Pharmacist

## 2011-07-13 ENCOUNTER — Ambulatory Visit: Payer: Medicaid Other | Admitting: Family Medicine

## 2011-07-13 VITALS — BP 106/78 | Wt 175.0 lb

## 2011-07-13 DIAGNOSIS — Z331 Pregnant state, incidental: Secondary | ICD-10-CM

## 2011-07-13 NOTE — Patient Instructions (Signed)
Take acyclovir as directed.  Go to C/Section as scheduled by OB-GYN Remember if you feel like your heart is racing, sit down and check heart rate.  If heart rate is greater than 110 while at rest please return for recheck. If any new or worsening of symptoms, blurry vision, headache, severe lower ext swelling return immediately for recheck. Return in 1 week for follow up appointment.

## 2011-07-15 LAB — STREP B DNA PROBE: GBSP: NEGATIVE

## 2011-07-16 NOTE — Progress Notes (Signed)
S: pt reports that she has had occasional episodes, that seem to happen in the morning, where her heart will seem like it is racing.  Unsure what is causing this but is concerned.  No chest pain.  No syncope.  Sometimes will become slightly sob when this occurs.  Also sometimes has ringing in her right ear.  States that d/c is a bit thicker than it has been.  Agrees to GC/Chlam screening as scheduled- pt pt is low risk.  Some suprapubic discomfort with standing.  Improved with sitting.  Occasional headache in the evening that resolves on it's on.  Transient lower ext edema- mild.  Occasional leg cramp.  O: as per flowsheet. Heart exam- RRR, no m/r/g.   Lung exam- CTA bilateral, no wheezing.    A and P:  1) heart racing :taught patient how to check heart rate. Normal at today's appt. Pt is to monitor heart rate when she feels like heart is racing and if greater than 110 while at rest pt is to return for recheck.  Pt is to keep close eye on symptoms and return as needed.  2)discharge- most likely physiologic- obtained GC/Chlam/GB strep at today's visit 3)Genital Herpes: Patient taking antiviral,  started at 36 weeks.-patient has refill already at home.  4)Rh negative --will administer rhogam within 72 hours of delivery of an Rh(D)-positive infant  5)repeat C-section scheduled for 07/28/2011.  6) patient to return in 1 weeks for followup appointment. 7)Postpartum plans:  IUD for birth control after delivery. Pt plans to breast and bottle feed.

## 2011-07-21 ENCOUNTER — Ambulatory Visit (INDEPENDENT_AMBULATORY_CARE_PROVIDER_SITE_OTHER): Payer: Medicaid Other | Admitting: Family Medicine

## 2011-07-21 DIAGNOSIS — Z331 Pregnant state, incidental: Secondary | ICD-10-CM

## 2011-07-21 NOTE — Progress Notes (Signed)
S: No complaints or concerns today.  Pt states that she continues to have occasional feelings of her heart racing. It does not last long enough for her to check heart rate. Has not had recently. No heart racing today. See above flowsheet.  O: as per flowsheet Cardiac:regular rate and rhythm, no murmurs rubs or gallops. Lungs-clear to auscultation.  A and P:  1) heart racing :Normal at today's appt. Pt is to monitor heart rate when she feels like heart is racing and if greater than 110 while at rest pt is to return for recheck. Pt is to keep close eye on symptoms and return as needed.  2)Labs- GC/Chlam/GB strep all Negative 3)Genital Herpes: Patient taking antiviral, started at 36 weeks. 4)Rh negative --will administer rhogam within 72 hours of delivery of an Rh(D)-positive infant  5)repeat C-section scheduled for 07/28/2011.  6)Postpartum plans: IUD for birth control after delivery. Pt plans to breast and bottle feed.

## 2011-07-21 NOTE — Assessment & Plan Note (Addendum)
-  See above documentation. 

## 2011-07-21 NOTE — Progress Notes (Signed)
Addended by: Kristen Cardinal on: 07/21/2011 01:02 PM   Modules accepted: Level of Service

## 2011-07-26 ENCOUNTER — Encounter (HOSPITAL_COMMUNITY): Payer: Self-pay

## 2011-07-26 ENCOUNTER — Encounter (HOSPITAL_COMMUNITY)
Admission: RE | Admit: 2011-07-26 | Discharge: 2011-07-26 | Disposition: A | Discharge status: Home / Self Care | Payer: Medicaid Other | Source: Ambulatory Visit | Attending: Obstetrics & Gynecology | Admitting: Obstetrics & Gynecology

## 2011-07-26 LAB — SURGICAL PCR SCREEN: Staphylococcus aureus: NEGATIVE

## 2011-07-26 LAB — CBC
HCT: 34.9 % — ABNORMAL LOW (ref 36.0–46.0)
Hemoglobin: 11.5 g/dL — ABNORMAL LOW (ref 12.0–15.0)
MCHC: 33 g/dL (ref 30.0–36.0)
MCV: 90.2 fL (ref 78.0–100.0)
RDW: 13.4 % (ref 11.5–15.5)

## 2011-07-26 LAB — RPR: RPR Ser Ql: NONREACTIVE

## 2011-07-26 NOTE — Patient Instructions (Addendum)
20 Cynthia Fletcher  07/26/2011   Your procedure is scheduled on:  07/28/11  Enter through the Main Entrance of [facility] at 730 AM.  Pick up the phone at the desk and dial [phone].   Call this number if you have problems the morning of surgery: [phone]   Remember:   Do not eat food:After Midnight.  Do not drink clear liquids: After Midnight.  Take these medicines the morning of surgery with A SIP OF WATER: Protonix as early as possible   Do not wear jewelry, make-up or nail polish.  Do not wear lotions, powders, or perfumes. You may wear deodorant.  Do not shave 48 hours prior to surgery.  Do not bring valuables to the hospital.  Contacts, dentures or bridgework may not be worn into surgery.  Leave suitcase in the car. After surgery it may be brought to your room.  For patients admitted to the hospital, checkout time is 11:00 AM the day of discharge.   Patients discharged the day of surgery will not be allowed to drive home.  Name and phone number of your driver: NA  Special Instructions: CHG Shower Use Special Wash: 1/2 bottle night before surgery and 1/2 bottle morning of surgery.   Please read over the following fact sheets that you were given: MRSA Information

## 2011-07-27 MED ORDER — CEFAZOLIN SODIUM-DEXTROSE 2-3 GM-% IV SOLR
2.0000 g | INTRAVENOUS | Status: AC
  Administered 2011-07-28: 2 g via INTRAVENOUS
  Filled 2011-07-27: qty 50

## 2011-07-28 ENCOUNTER — Encounter (HOSPITAL_COMMUNITY): Payer: Self-pay | Admitting: General Surgery

## 2011-07-28 ENCOUNTER — Encounter (HOSPITAL_COMMUNITY): Payer: Self-pay | Admitting: Anesthesiology

## 2011-07-28 ENCOUNTER — Encounter (HOSPITAL_COMMUNITY)
Admission: RE | Disposition: A | Discharge status: Home / Self Care | Payer: Self-pay | Source: Ambulatory Visit | Attending: Obstetrics & Gynecology

## 2011-07-28 ENCOUNTER — Encounter (HOSPITAL_COMMUNITY): Payer: Self-pay | Admitting: *Deleted

## 2011-07-28 ENCOUNTER — Inpatient Hospital Stay (HOSPITAL_COMMUNITY)
Admission: RE | Admit: 2011-07-28 | Discharge: 2011-07-31 | DRG: 766 | Disposition: A | Discharge status: Home / Self Care | Payer: Medicaid Other | Source: Ambulatory Visit | Attending: Obstetrics & Gynecology | Admitting: Obstetrics & Gynecology

## 2011-07-28 ENCOUNTER — Inpatient Hospital Stay (HOSPITAL_COMMUNITY): Payer: Medicaid Other | Admitting: Anesthesiology

## 2011-07-28 DIAGNOSIS — Z01818 Encounter for other preprocedural examination: Secondary | ICD-10-CM

## 2011-07-28 DIAGNOSIS — O34219 Maternal care for unspecified type scar from previous cesarean delivery: Principal | ICD-10-CM | POA: Diagnosis present

## 2011-07-28 DIAGNOSIS — Z01812 Encounter for preprocedural laboratory examination: Secondary | ICD-10-CM

## 2011-07-28 LAB — TYPE AND SCREEN
ABO/RH(D): A NEG
Antibody Screen: NEGATIVE

## 2011-07-28 SURGERY — Surgical Case
Anesthesia: Spinal | Site: Abdomen | Wound class: Clean Contaminated

## 2011-07-28 MED ORDER — IBUPROFEN 600 MG PO TABS
600.0000 mg | ORAL_TABLET | Freq: Four times a day (QID) | ORAL | Status: DC | PRN
  Administered 2011-07-28 – 2011-07-29 (×2): 600 mg via ORAL
  Filled 2011-07-28 (×8): qty 1

## 2011-07-28 MED ORDER — DIBUCAINE 1 % RE OINT
1.0000 "application " | TOPICAL_OINTMENT | RECTAL | Status: DC | PRN
  Administered 2011-07-28: 1 via RECTAL
  Filled 2011-07-28: qty 28

## 2011-07-28 MED ORDER — HYDROCORTISONE 2.5 % RE CREA
TOPICAL_CREAM | Freq: Three times a day (TID) | RECTAL | Status: DC | PRN
  Administered 2011-07-30: 04:00:00 via RECTAL
  Filled 2011-07-28: qty 28.35

## 2011-07-28 MED ORDER — MORPHINE SULFATE (PF) 0.5 MG/ML IJ SOLN
INTRAMUSCULAR | Status: DC | PRN
  Administered 2011-07-28: .2 mg via INTRATHECAL

## 2011-07-28 MED ORDER — SENNOSIDES-DOCUSATE SODIUM 8.6-50 MG PO TABS
2.0000 | ORAL_TABLET | Freq: Every day | ORAL | Status: DC
  Administered 2011-07-28 – 2011-07-30 (×2): 2 via ORAL

## 2011-07-28 MED ORDER — SODIUM CHLORIDE 0.9 % IJ SOLN: 3.0000 mL | INTRAMUSCULAR | Status: DC | PRN

## 2011-07-28 MED ORDER — LACTATED RINGERS IV SOLN
INTRAVENOUS | Status: DC
  Administered 2011-07-28: 22:00:00 via INTRAVENOUS

## 2011-07-28 MED ORDER — PROMETHAZINE HCL 25 MG/ML IJ SOLN: 6.2500 mg | INTRAMUSCULAR | Status: DC | PRN

## 2011-07-28 MED ORDER — LANOLIN HYDROUS EX OINT: 1.0000 "application " | TOPICAL_OINTMENT | CUTANEOUS | Status: DC | PRN

## 2011-07-28 MED ORDER — BUPIVACAINE HCL (PF) 0.25 % IJ SOLN
INTRAMUSCULAR | Status: AC
  Filled 2011-07-28: qty 30

## 2011-07-28 MED ORDER — EPHEDRINE 5 MG/ML INJ
INTRAVENOUS | Status: AC
  Filled 2011-07-28: qty 10

## 2011-07-28 MED ORDER — ONDANSETRON HCL 4 MG/2ML IJ SOLN: 4.0000 mg | INTRAMUSCULAR | Status: DC | PRN

## 2011-07-28 MED ORDER — WITCH HAZEL-GLYCERIN EX PADS
1.0000 "application " | MEDICATED_PAD | CUTANEOUS | Status: DC | PRN
  Administered 2011-07-28: 1 via TOPICAL

## 2011-07-28 MED ORDER — PRENATAL MULTIVITAMIN CH
1.0000 | ORAL_TABLET | Freq: Every day | ORAL | Status: DC
  Administered 2011-07-29 – 2011-07-31 (×3): 1 via ORAL
  Filled 2011-07-28 (×2): qty 1

## 2011-07-28 MED ORDER — SODIUM CHLORIDE 0.9 % IV SOLN
1.0000 ug/kg/h | INTRAVENOUS | Status: DC | PRN
  Filled 2011-07-28: qty 2.5

## 2011-07-28 MED ORDER — OXYTOCIN 20 UNITS IN LACTATED RINGERS INFUSION - SIMPLE
INTRAVENOUS | Status: DC | PRN
  Administered 2011-07-28: 20 [IU] via INTRAVENOUS

## 2011-07-28 MED ORDER — DIPHENHYDRAMINE HCL 25 MG PO CAPS
25.0000 mg | ORAL_CAPSULE | ORAL | Status: DC | PRN
  Administered 2011-07-28: 25 mg via ORAL
  Filled 2011-07-28: qty 1

## 2011-07-28 MED ORDER — PHENYLEPHRINE HCL 10 MG/ML IJ SOLN
INTRAMUSCULAR | Status: DC | PRN
  Administered 2011-07-28: 40 ug via INTRAVENOUS
  Administered 2011-07-28 (×2): 80 ug via INTRAVENOUS

## 2011-07-28 MED ORDER — KETOROLAC TROMETHAMINE 30 MG/ML IJ SOLN: 30.0000 mg | Freq: Four times a day (QID) | INTRAMUSCULAR | Status: AC | PRN

## 2011-07-28 MED ORDER — OXYTOCIN 10 UNIT/ML IJ SOLN
INTRAMUSCULAR | Status: AC
  Filled 2011-07-28: qty 4

## 2011-07-28 MED ORDER — ONDANSETRON HCL 4 MG PO TABS: 4.0000 mg | ORAL_TABLET | ORAL | Status: DC | PRN

## 2011-07-28 MED ORDER — MENTHOL 3 MG MT LOZG: 1.0000 | LOZENGE | OROMUCOSAL | Status: DC | PRN

## 2011-07-28 MED ORDER — TETANUS-DIPHTH-ACELL PERTUSSIS 5-2.5-18.5 LF-MCG/0.5 IM SUSP
0.5000 mL | Freq: Once | INTRAMUSCULAR | Status: AC
  Administered 2011-07-30: 0.5 mL via INTRAMUSCULAR
  Filled 2011-07-28: qty 0.5

## 2011-07-28 MED ORDER — FENTANYL CITRATE 0.05 MG/ML IJ SOLN
INTRAMUSCULAR | Status: DC | PRN
  Administered 2011-07-28: 75 ug via INTRAVENOUS

## 2011-07-28 MED ORDER — KETOROLAC TROMETHAMINE 30 MG/ML IJ SOLN: 15.0000 mg | Freq: Once | INTRAMUSCULAR | Status: AC | PRN

## 2011-07-28 MED ORDER — SODIUM CHLORIDE 0.9 % IV SOLN: 250.0000 mL | INTRAVENOUS | Status: DC

## 2011-07-28 MED ORDER — KETOROLAC TROMETHAMINE 60 MG/2ML IM SOLN
60.0000 mg | Freq: Once | INTRAMUSCULAR | Status: AC | PRN
  Administered 2011-07-28: 60 mg via INTRAMUSCULAR

## 2011-07-28 MED ORDER — NALOXONE HCL 0.4 MG/ML IJ SOLN: 0.4000 mg | INTRAMUSCULAR | Status: DC | PRN

## 2011-07-28 MED ORDER — ONDANSETRON HCL 4 MG/2ML IJ SOLN
INTRAMUSCULAR | Status: AC
  Filled 2011-07-28: qty 2

## 2011-07-28 MED ORDER — OXYTOCIN 20 UNITS IN LACTATED RINGERS INFUSION - SIMPLE: 125.0000 mL/h | INTRAVENOUS | Status: AC

## 2011-07-28 MED ORDER — NALBUPHINE HCL 10 MG/ML IJ SOLN
5.0000 mg | INTRAMUSCULAR | Status: DC | PRN
  Administered 2011-07-28: 10 mg via SUBCUTANEOUS
  Filled 2011-07-28: qty 1

## 2011-07-28 MED ORDER — HYDROMORPHONE HCL PF 1 MG/ML IJ SOLN: 0.2500 mg | INTRAMUSCULAR | Status: DC | PRN

## 2011-07-28 MED ORDER — FENTANYL CITRATE 0.05 MG/ML IJ SOLN
INTRAMUSCULAR | Status: DC | PRN
  Administered 2011-07-28: 25 ug via INTRATHECAL

## 2011-07-28 MED ORDER — KETOROLAC TROMETHAMINE 60 MG/2ML IM SOLN
INTRAMUSCULAR | Status: AC
  Administered 2011-07-28: 60 mg via INTRAMUSCULAR
  Filled 2011-07-28: qty 2

## 2011-07-28 MED ORDER — OXYCODONE-ACETAMINOPHEN 5-325 MG PO TABS
1.0000 | ORAL_TABLET | ORAL | Status: DC | PRN
  Administered 2011-07-29 (×2): 1 via ORAL
  Administered 2011-07-30: 2 via ORAL
  Administered 2011-07-30 (×3): 1 via ORAL
  Filled 2011-07-28 (×3): qty 1
  Filled 2011-07-28: qty 2
  Filled 2011-07-28 (×2): qty 1

## 2011-07-28 MED ORDER — IBUPROFEN 600 MG PO TABS
600.0000 mg | ORAL_TABLET | Freq: Four times a day (QID) | ORAL | Status: DC
  Administered 2011-07-28 – 2011-07-31 (×7): 600 mg via ORAL
  Filled 2011-07-28 (×3): qty 1

## 2011-07-28 MED ORDER — ZOLPIDEM TARTRATE 5 MG PO TABS: 5.0000 mg | ORAL_TABLET | Freq: Every evening | ORAL | Status: DC | PRN

## 2011-07-28 MED ORDER — MEPERIDINE HCL 25 MG/ML IJ SOLN: 6.2500 mg | INTRAMUSCULAR | Status: DC | PRN

## 2011-07-28 MED ORDER — MORPHINE SULFATE (PF) 0.5 MG/ML IJ SOLN
INTRAMUSCULAR | Status: DC | PRN
  Administered 2011-07-28: 4.8 mg via INTRAVENOUS

## 2011-07-28 MED ORDER — OXYTOCIN 20 UNITS IN LACTATED RINGERS INFUSION - SIMPLE
INTRAVENOUS | Status: AC
  Administered 2011-07-28: 20 [IU]
  Filled 2011-07-28: qty 1000

## 2011-07-28 MED ORDER — EPHEDRINE SULFATE 50 MG/ML IJ SOLN
INTRAMUSCULAR | Status: DC | PRN
  Administered 2011-07-28 (×2): 5 mg via INTRAVENOUS
  Administered 2011-07-28: 10 mg via INTRAVENOUS

## 2011-07-28 MED ORDER — MAGNESIUM HYDROXIDE 400 MG/5ML PO SUSP: 30.0000 mL | ORAL | Status: DC | PRN

## 2011-07-28 MED ORDER — DIPHENHYDRAMINE HCL 50 MG/ML IJ SOLN: 12.5000 mg | INTRAMUSCULAR | Status: DC | PRN

## 2011-07-28 MED ORDER — SIMETHICONE 80 MG PO CHEW
80.0000 mg | CHEWABLE_TABLET | ORAL | Status: DC | PRN
  Administered 2011-07-28 – 2011-07-30 (×7): 80 mg via ORAL

## 2011-07-28 MED ORDER — SODIUM CHLORIDE 0.9 % IJ SOLN: 3.0000 mL | Freq: Two times a day (BID) | INTRAMUSCULAR | Status: DC

## 2011-07-28 MED ORDER — BUPIVACAINE IN DEXTROSE 0.75-8.25 % IT SOLN
INTRATHECAL | Status: DC | PRN
  Administered 2011-07-28: 1.6 mg via INTRATHECAL

## 2011-07-28 MED ORDER — DIPHENHYDRAMINE HCL 25 MG PO CAPS: 25.0000 mg | ORAL_CAPSULE | Freq: Four times a day (QID) | ORAL | Status: DC | PRN

## 2011-07-28 MED ORDER — PHENYLEPHRINE 40 MCG/ML (10ML) SYRINGE FOR IV PUSH (FOR BLOOD PRESSURE SUPPORT)
PREFILLED_SYRINGE | INTRAVENOUS | Status: AC
  Filled 2011-07-28: qty 5

## 2011-07-28 MED ORDER — LACTATED RINGERS IV SOLN
INTRAVENOUS | Status: DC
  Administered 2011-07-28 (×3): via INTRAVENOUS

## 2011-07-28 MED ORDER — BUPIVACAINE HCL (PF) 0.25 % IJ SOLN
INTRAMUSCULAR | Status: DC | PRN
  Administered 2011-07-28: 30 mL

## 2011-07-28 MED ORDER — NALBUPHINE SYRINGE 5 MG/0.5 ML
INJECTION | INTRAMUSCULAR | Status: AC
  Administered 2011-07-28: 10 mg via SUBCUTANEOUS
  Filled 2011-07-28: qty 1

## 2011-07-28 MED ORDER — MORPHINE SULFATE 0.5 MG/ML IJ SOLN
INTRAMUSCULAR | Status: AC
  Filled 2011-07-28: qty 10

## 2011-07-28 MED ORDER — DIPHENHYDRAMINE HCL 50 MG/ML IJ SOLN: 25.0000 mg | INTRAMUSCULAR | Status: DC | PRN

## 2011-07-28 MED ORDER — ONDANSETRON HCL 4 MG/2ML IJ SOLN
INTRAMUSCULAR | Status: DC | PRN
  Administered 2011-07-28: 4 mg via INTRAVENOUS

## 2011-07-28 MED ORDER — ONDANSETRON HCL 4 MG/2ML IJ SOLN: 4.0000 mg | Freq: Three times a day (TID) | INTRAMUSCULAR | Status: DC | PRN

## 2011-07-28 MED ORDER — NALBUPHINE HCL 10 MG/ML IJ SOLN
5.0000 mg | INTRAMUSCULAR | Status: DC | PRN
  Filled 2011-07-28: qty 1

## 2011-07-28 MED ORDER — SCOPOLAMINE 1 MG/3DAYS TD PT72
1.0000 | MEDICATED_PATCH | TRANSDERMAL | Status: DC
  Administered 2011-07-28: 1.5 mg via TRANSDERMAL

## 2011-07-28 MED ORDER — FENTANYL CITRATE 0.05 MG/ML IJ SOLN
INTRAMUSCULAR | Status: AC
  Filled 2011-07-28: qty 2

## 2011-07-28 SURGICAL SUPPLY — 24 items
CHLORAPREP W/TINT 26ML (MISCELLANEOUS) ×2 IMPLANT
CLOTH BEACON ORANGE TIMEOUT ST (SAFETY) ×2 IMPLANT
DRESSING TELFA 8X3 (GAUZE/BANDAGES/DRESSINGS) ×2 IMPLANT
ELECT REM PT RETURN 9FT ADLT (ELECTROSURGICAL) ×2
ELECTRODE REM PT RTRN 9FT ADLT (ELECTROSURGICAL) ×1 IMPLANT
GAUZE SPONGE 4X4 12PLY STRL LF (GAUZE/BANDAGES/DRESSINGS) ×4 IMPLANT
GLOVE BIO SURGEON STRL SZ7 (GLOVE) ×2 IMPLANT
GLOVE BIOGEL PI IND STRL 7.0 (GLOVE) ×2 IMPLANT
GLOVE BIOGEL PI INDICATOR 7.0 (GLOVE) ×2
GOWN PREVENTION PLUS LG XLONG (DISPOSABLE) ×6 IMPLANT
NEEDLE HYPO 22GX1.5 SAFETY (NEEDLE) ×2 IMPLANT
NS IRRIG 1000ML POUR BTL (IV SOLUTION) ×2 IMPLANT
PACK C SECTION WH (CUSTOM PROCEDURE TRAY) ×2 IMPLANT
PAD ABD 7.5X8 STRL (GAUZE/BANDAGES/DRESSINGS) ×1 IMPLANT
SUT MNCRL 0 VIOLET CTX 36 (SUTURE) ×2 IMPLANT
SUT MONOCRYL 0 CTX 36 (SUTURE) ×2
SUT PDS AB 0 CT1 27 (SUTURE) ×1 IMPLANT
SUT VIC AB 0 CT1 36 (SUTURE) ×4 IMPLANT
SUT VIC AB 4-0 KS 27 (SUTURE) ×2 IMPLANT
SYR CONTROL 10ML LL (SYRINGE) ×2 IMPLANT
TAPE CLOTH SURG 4X10 WHT LF (GAUZE/BANDAGES/DRESSINGS) ×1 IMPLANT
TOWEL OR 17X24 6PK STRL BLUE (TOWEL DISPOSABLE) ×4 IMPLANT
TRAY FOLEY CATH 14FR (SET/KITS/TRAYS/PACK) ×2 IMPLANT
WATER STERILE IRR 1000ML POUR (IV SOLUTION) ×2 IMPLANT

## 2011-07-28 NOTE — Op Note (Signed)
Cynthia Fletcher PROCEDURE DATE: 07/28/2011  PREOPERATIVE DIAGNOSIS: Intrauterine pregnancy at  [redacted]w[redacted]d weeks gestation; previous cesarean section x 2, desires repeat section.  POSTOPERATIVE DIAGNOSIS: The same  PROCEDURE: Repeat Low Transverse Cesarean Section  SURGEON:  Dr. Jaynie Collins  ASSISTANTS:  Dr. Candelaria Celeste, Dr. Ellin Mayhew  ANESTHESIOLOGIST: Sandrea Hughs., MD  INDICATIONS: Cynthia Fletcher is a 36 y.o. [id number] at [redacted]w[redacted]d scheduled for repeat cesarean section.  The risks of repeat cesarean section were discussed with the patient including but were not limited to: bleeding which may require transfusion or reoperation; infection which may require antibiotics; injury to bowel, bladder, ureters or other surrounding organs; injury to the fetus; need for additional procedures including hysterectomy in the event of a life-threatening hemorrhage; placental abnormalities wth subsequent pregnancies, incisional problems, thromboembolic phenomenon and other postoperative/anesthesia complications.  The patient concurred with the proposed plan, giving informed written consent for the procedure.    FINDINGS:  Viable female infant in cephalic presentation.  Apgars 9 and 9, weight, 7 pounds and 7 ounces.  Clear amniotic fluid.  Intact placenta, three vessel cord.  Normal uterus, normal fallopian tubes and ovaries bilaterally on palpation. There was diffuse dense adhesive disease present.  ANESTHESIA: Spinal INTRAVENOUS FLUIDS: 2700 ml ESTIMATED BLOOD LOSS: 750 ml URINE OUTPUT:  250 ml SPECIMENS: Placenta sent to L&D COMPLICATIONS: None immediate  PROCEDURE IN DETAIL:  The patient received intravenous antibiotics and had sequential compression devices applied to her lower extremities while in the preoperative area.  She was then taken to the operating room where spinal anesthesia was administered and was found to be adequate. She was then placed in a dorsal supine position with a leftward tilt,  and prepped and draped in a sterile manner.  A foley catheter was placed into her bladder and attached to constant gravity.  After an adequate timeout was performed, a Pfannenstiel skin incision was made with scalpel over her preexisting scar and carried through to the underlying layer of fascia. The fascia was incised in the midline, and this incision was extended bilaterally using the Mayo scissors.  Given dense adhesions that were encountered, a modified Mallard incision was made involving the rectus muscles and the peritoneal layer; the epigastric vessels were not encountered.  There were dense adhesions of the uterus to the anterior abdominal wall, more adhesions were also palpated in the superior part of the uterus to the abdominal wall, making it difficult to access the adnexae.  The adhesions were lysed bluntly and sharply around the lower uterine segment.  A low transverse hysterotomy was made with a scalpel and extended bilaterally bluntly. The lower uterine segment was remarkably thin.  The infant was successfully delivered, the cord was clamped and cut and the infant was handed over to awaiting neonatology team. Uterine massage was then administered, and the placenta delivered intact with a three-vessel cord. The uterus was then cleared of clot and debris.  The hysterotomy was closed with 0 Monocryl in a running locked fashion in one layer. The pelvis was cleared of all clot and debris. Hemostasis was confirmed on all surfaces.  The peritoneum and the muscles were reapproximated using 0 Monocryl interrupted stitches. The fascia was then closed using 0 PDS in a running fashion.  The subcutaneous layer was irrigated, then reapproximated with 4-0 Vicryl interrupted stitches, and the skin was closed with a 4-0 Vicryl subcuticular stitch. The patient tolerated the procedure well. Sponge, lap, instrument and needle counts were correct x 2.  She was taken  to the recovery room in stable condition.

## 2011-07-28 NOTE — H&P (Signed)
Obstetric History and Physical  Cynthia Fletcher is a 36 y.o. female [phone] with IUP at [redacted]w[redacted]d presenting for scheduled repeat cesarean section; this will be her third section. No concerns.    Prenatal Course Source of Care: [facility] with Dr. Ellin Mayhew  with onset of care at 23 weeks Pregnancy complications or risks: Patient Active Problem List  Diagnoses  . HEMORRHOIDS, EXTERNAL  . ALLERGIC RHINITIS  . PREVIOUS C-SECT DELIVERY ANTPRTM COND/COMP  . RH FACTOR, NEGATIVE  . GENITAL HERPES, HX OF  . Susceptible to varicella (non-immune), currently pregnant  . Pregnancy, supervision of normal  She desires to use IUD for contraception.  She plans to breastfeed,and bottle feed  Prenatal labs and studies: ABO, Rh: A/NEG/-- (10/18 1508) Antibody: NEG (11/20 1032) Rubella:  immune RPR: NON REACTIVE (02/05 1058)  HBsAg: NEGATIVE (10/18 1508)  HIV: NON REACTIVE (10/18 1508)  GC/Chlam: Neg/Neg GBS: NEGATIVE (01/23 1507)  1 hr Glucola 127 Genetic screening: Presented too late Anatomy US normal  Past Medical History:  Past Medical History  Diagnosis Date  . GERD (gastroesophageal reflux disease)    Past Surgical History:  Past Surgical History  Procedure Date  . Cesarean section     x 2  D&C x 2  Obstetrical History:  OB History    Grav Para Term Preterm Abortions TAB SAB Ect Mult Living   5 2 2  2     2     C/S x 2, TAB x2  Gynecological History: Noncontributory  Social History:  History   Social History  . Marital Status: Married    Spouse Name: N/A    Number of Children: N/A  . Years of Education: N/A   Social History Main Topics  . Smoking status: Never Smoker   . Smokeless tobacco: None  . Alcohol Use: No  . Drug Use: No  . Sexually Active: Yes    Birth Control/ Protection: Condom     1 partner- married   Other Topics Concern  . None   Social History Narrative   Pt is originally from [place], first language is arabic, completed HS, no college.  Is married.   Muslim faith.  Husband works as a Location manager.    Family History:  Family History  Problem Relation Age of Onset  . Parkinsonism Mother   . Hypertension Father   . Parkinsonism Sister   . Parkinsonism Brother    Medications:  Prenatal vitamins,  Current Facility-Administered Medications  Medication Dose Route Frequency Provider Last Rate Last Dose  . ceFAZolin (ANCEF) IVPB 2 g/50 mL premix  2 g Intravenous On Call to OR Shawnya Mayor A Jett Kulzer, MD      . lactated ringers infusion   Intravenous Continuous Tereso Newcomer, MD 125 mL/hr at 07/28/11 0758    . scopolamine (TRANSDERM-SCOP) 1.5 MG 1.5 mg  1 patch Transdermal Q72H John Linward Foster., MD   1.5 mg at 07/28/11 3235   Allergies: No Known Allergies  Review of Systems: Negative  Physical Exam: Blood pressure 103/62, pulse 99, temperature 98.2 F (36.8 C), temperature source Oral, resp. rate 18, last menstrual period 10/28/2010, SpO2 99.00%. FHR 135. GENERAL: Well-developed, well-nourished female in no acute distress.  LUNGS: Clear to auscultation bilaterally.  HEART: Regular rate and rhythm. ABDOMEN: Soft, nontender, nondistended, gravid. EXTREMITIES: Nontender, no edema, 2+ distal pulses.  Pertinent Labs/Studies:  Recent Results (from the past 336 hour(s))  CBC   Collection Time   07/26/11 10:58 AM  Component Value Range   WBC 8.9  4.0 - 10.5 (K/uL)   RBC 3.87  3.87 - 5.11 (MIL/uL)   Hemoglobin 11.5 (*) 12.0 - 15.0 (g/dL)   HCT 96.0 (*) 45.4 - 46.0 (%)   MCV 90.2  78.0 - 100.0 (fL)   MCH 29.7  26.0 - 34.0 (pg)   MCHC 33.0  30.0 - 36.0 (g/dL)   RDW 09.8  11.9 - 14.7 (%)   Platelets 201  150 - 400 (K/uL)  SURGICAL PCR SCREEN   Collection Time   07/26/11 10:58 AM      Component Value Range   MRSA, PCR NEGATIVE  NEGATIVE    Staphylococcus aureus NEGATIVE  NEGATIVE   RPR   Collection Time   07/26/11 10:58 AM      Component Value Range   RPR NON REACTIVE  NON REACTIVE    Assessment and Plan: Willie Loy is  a 36 y.o. [id number] at [redacted]w[redacted]d being admitted for scheduled repeat cesarean section.  The risks of cesarean section discussed with the patient included but were not limited to: bleeding which may require transfusion or reoperation; infection which may require antibiotics; injury to bowel, bladder, ureters or other surrounding organs; injury to the fetus; need for additional procedures including hysterectomy in the event of a life-threatening hemorrhage; placental abnormalities wth subsequent pregnancies, incisional problems, thromboembolic phenomenon and other postoperative/anesthesia complications. The patient concurred with the proposed plan, giving informed written consent for the procedure.   Patient has been NPO since midnight she will remain NPO for procedure. Preoperative prophylacticantibiotics ordered on call to the OR.  To OR when ready.   Jaynie Collins, M.D. 07/28/2011 8:29 AM

## 2011-07-28 NOTE — Anesthesia Preprocedure Evaluation (Signed)
Anesthesia Evaluation  Patient identified by MRN, date of birth, ID band Patient awake    Reviewed: Allergy & Precautions, H&P , NPO status , Patient's Chart, lab work & pertinent test results  Airway Mallampati: I TM Distance: >3 FB Neck ROM: full    Dental No notable dental hx.    Pulmonary neg pulmonary ROS,    Pulmonary exam normal       Cardiovascular neg cardio ROS     Neuro/Psych Negative Neurological ROS  Negative Psych ROS   GI/Hepatic negative GI ROS, Neg liver ROS, GERD-  Medicated,  Endo/Other  Negative Endocrine ROS  Renal/GU negative Renal ROS  Genitourinary negative   Musculoskeletal negative musculoskeletal ROS (+)   Abdominal Normal abdominal exam  (+)   Peds negative pediatric ROS (+)  Hematology negative hematology ROS (+)   Anesthesia Other Findings   Reproductive/Obstetrics (+) Pregnancy                           Anesthesia Physical Anesthesia Plan  ASA: II  Anesthesia Plan: Spinal   Post-op Pain Management:    Induction:   Airway Management Planned:   Additional Equipment:   Intra-op Plan:   Post-operative Plan:   Informed Consent: I have reviewed the patients History and Physical, chart, labs and discussed the procedure including the risks, benefits and alternatives for the proposed anesthesia with the patient or authorized representative who has indicated his/her understanding and acceptance.     Plan Discussed with: CRNA  Anesthesia Plan Comments:         Anesthesia Quick Evaluation

## 2011-07-28 NOTE — Addendum Note (Signed)
Addendum  created 07/28/11 1705 by Cephus Shelling, CRNA   Modules edited:Notes Section

## 2011-07-28 NOTE — Anesthesia Postprocedure Evaluation (Signed)
  Anesthesia Post Note  Patient: Cynthia Fletcher  Procedure(s) Performed:  CESAREAN SECTION  Anesthesia type: Spinal  Patient location: Mother/Baby  Post pain: Pain level controlled  Post assessment: Post-op Vital signs reviewed  Last Vitals:  Filed Vitals:   07/28/11 1544  BP: 99/60  Pulse: 80  Temp: 36.9 C  Resp: 20    Post vital signs: Reviewed  Level of consciousness: awake  Complications: No apparent anesthesia complications

## 2011-07-28 NOTE — Anesthesia Postprocedure Evaluation (Signed)
Anesthesia Post Note  Patient: Cynthia Fletcher  Procedure(s) Performed:  CESAREAN SECTION  Anesthesia type: Spinal  Patient location: PACU  Post pain: Pain level controlled  Post assessment: Post-op Vital signs reviewed  Last Vitals:  Filed Vitals:   07/28/11 1100  BP: 112/56  Pulse: 69  Temp: 36.4 C  Resp: 19    Post vital signs: Reviewed  Level of consciousness: awake  Complications: No apparent anesthesia complications

## 2011-07-28 NOTE — Anesthesia Procedure Notes (Signed)
Spinal  Patient location during procedure: OR Start time: 07/28/2011 8:56 AM End time: 07/28/2011 8:59 AM Staffing Anesthesiologist: Sandrea Hughs Performed by: anesthesiologist  Preanesthetic Checklist Completed: patient identified, site marked, surgical consent, pre-op evaluation, timeout performed, IV checked, risks and benefits discussed and monitors and equipment checked Spinal Block Patient position: sitting Prep: DuraPrep Patient monitoring: heart rate, cardiac monitor, continuous pulse ox and blood pressure Approach: midline Location: L3-4 Injection technique: single-shot Needle Needle type: Sprotte  Needle gauge: 24 G Needle length: 9 cm Needle insertion depth: 7 cm Assessment Sensory level: T4

## 2011-07-28 NOTE — Transfer of Care (Signed)
Immediate Anesthesia Transfer of Care Note  Patient: Cynthia Fletcher  Procedure(s) Performed:  CESAREAN SECTION  Patient Location: PACU  Anesthesia Type: Spinal  Level of Consciousness: awake and alert   Airway & Oxygen Therapy: Patient Spontanous Breathing  Post-op Assessment: Report given to PACU RN and Post -op Vital signs reviewed and stable  Post vital signs: stable  Complications: No apparent anesthesia complications

## 2011-07-29 LAB — CBC
HCT: 30.3 % — ABNORMAL LOW (ref 36.0–46.0)
MCHC: 32.7 g/dL (ref 30.0–36.0)
Platelets: 191 10*3/uL (ref 150–400)
RDW: 13.4 % (ref 11.5–15.5)
WBC: 10.5 10*3/uL (ref 4.0–10.5)

## 2011-07-29 MED ORDER — HYDROCORTISONE ACE-PRAMOXINE 1-1 % RE FOAM: 1.0000 | Freq: Two times a day (BID) | RECTAL | Status: DC

## 2011-07-29 NOTE — Progress Notes (Signed)
UR chart review completed.  

## 2011-07-29 NOTE — Progress Notes (Signed)
Subjective: Postpartum Day 1: Cesarean Delivery Patient reports incisional pain, tolerating PO, + flatus and no problems voiding.    Objective: Vital signs in last 24 hours: Temp:  [97.6 F (36.4 C)-98.8 F (37.1 C)] 98.3 F (36.8 C) (02/08 0530) Pulse Rate:  [69-99] 74  (02/08 0530) Resp:  [16-20] 18  (02/08 0530) BP: (94-122)/(43-74) 96/57 mmHg (02/08 0530) SpO2:  [92 %-100 %] 93 % (02/08 0530) Weight:  [79.833 kg (176 lb)] 79.833 kg (176 lb) (02/07 1332)  Physical Exam:  General: alert, cooperative and no distress Lochia: appropriate Uterine Fundus: firm Incision: bandage still on; small amount of drainage previously marked- no new drainage DVT Evaluation: No evidence of DVT seen on physical exam. Negative Homan's sign. No cords or calf tenderness. No significant calf/ankle edema.   Basename 07/29/11 0525 07/26/11 1058  HGB 9.9* 11.5*  HCT 30.3* 34.9*    Assessment/Plan: Status post Cesarean section. Doing well postoperatively.  Continue current care.  Breastfeeding.  Desires [place].  Joellyn Haff, SNM 07/29/2011, 7:38 AM

## 2011-07-29 NOTE — Plan of Care (Signed)
Problem: Phase II Progression Outcomes Goal: Rh isoimmunization per orders Outcome: Not Applicable Date Met:  07/29/11 Pt A- and baby A-, NAC.

## 2011-07-30 LAB — URINALYSIS, ROUTINE W REFLEX MICROSCOPIC
Leukocytes, UA: NEGATIVE
Nitrite: NEGATIVE
Protein, ur: NEGATIVE mg/dL
Specific Gravity, Urine: 1.02 (ref 1.005–1.030)
Urobilinogen, UA: 0.2 mg/dL (ref 0.0–1.0)

## 2011-07-30 LAB — URINE MICROSCOPIC-ADD ON

## 2011-07-30 NOTE — Progress Notes (Signed)
Postpartum Day #2: Cesarean Delivery  Subjective:  Patient reports incisional pain, mild; burning with urination; breast and bottlefeeding; desires Mirena for contraception; declines early d/c  Objective: Vital signs in last 24 hours: Temp:  [98.1 F (36.7 C)-98.5 F (36.9 C)] 98.1 F (36.7 C) (02/09 0555) Pulse Rate:  [71-85] 85  (02/09 0555) Resp:  [18-20] 18  (02/09 0555) BP: (98-108)/(62-72) 103/65 mmHg (02/09 0555) SpO2:  [96 %] 96 % (02/08 0940)  Physical Exam:  General: alert, cooperative and mild distress Lochia: appropriate Uterine Fundus: firm Incision: healing well, no significant drainage, no dehiscence, no significant erythema  Basename 07/29/11 0525  HGB 9.9*  HCT 30.3*    Assessment/Plan: Status post Cesarean section. Doing well postoperatively.  Continue current care. Send urinalysis  Cynthia Fletcher 07/30/2011, 9:09 AM

## 2011-07-31 MED ORDER — IBUPROFEN 600 MG PO TABS: 600.0000 mg | ORAL_TABLET | Freq: Four times a day (QID) | ORAL | Status: AC | PRN

## 2011-07-31 MED ORDER — OXYCODONE-ACETAMINOPHEN 5-325 MG PO TABS: 1.0000 | ORAL_TABLET | ORAL | Status: AC | PRN

## 2011-07-31 NOTE — Progress Notes (Signed)
Subjective: Postpartum Day 3: Cesarean Delivery Patient reports incisional pain, tolerating PO, + flatus and no problems voiding.    Objective: Vital signs in last 24 hours: Temp:  [98.2 F (36.8 C)-98.5 F (36.9 C)] 98.2 F (36.8 C) (02/10 0513) Pulse Rate:  [76-85] 76  (02/10 0513) Resp:  [18] 18  (02/10 0513) BP: (100-113)/(66-77) 110/75 mmHg (02/10 0513)  Physical Exam:  General: alert, cooperative, appears stated age and no distress Lochia: appropriate Uterine Fundus: firm Incision: healing well, no significant drainage DVT Evaluation: No evidence of DVT seen on physical exam. Negative Homan's sign. No cords or calf tenderness. No significant calf/ankle edema.   Basename 07/29/11 0525  HGB 9.9*  HCT 30.3*    Assessment/Plan: Status post Cesarean section. Doing well postoperatively.  Discharge home with standard precautions and return to clinic in 4-6 weeks.  Zerita Boers 07/31/2011, 9:56 AM

## 2011-07-31 NOTE — Discharge Summary (Signed)
Obstetric Discharge Summary Reason for Admission: onset of labor Prenatal Procedures: ultrasound Intrapartum Procedures: cesarean: low cervical, transverse Postpartum Procedures: none Complications-Operative and Postpartum: none Hemoglobin  Date Value Range Status  07/29/2011 9.9* 12.0-15.0 (g/dL) Final     HCT  Date Value Range Status  07/29/2011 30.3* 36.0-46.0 (%) Final    Discharge Diagnoses: Term Pregnancy-delivered  Discharge Information: Date: 07/31/2011 Activity: pelvic rest Diet: routine Medications: PNV, Ibuprofen and Percocet Condition: stable and improved Instructions: refer to practice specific booklet Discharge to: home   Newborn Data: Live born female  Birth Weight: 7 lb 7.2 oz (3380 g) APGAR: 9, 9  Home with mother.  Zerita Boers 07/31/2011, 9:55 AM

## 2011-08-01 ENCOUNTER — Encounter (HOSPITAL_COMMUNITY): Payer: Self-pay | Admitting: Obstetrics & Gynecology

## 2013-05-09 ENCOUNTER — Other Ambulatory Visit: Payer: Self-pay | Admitting: Nurse Practitioner

## 2013-05-09 DIAGNOSIS — T8332XA Displacement of intrauterine contraceptive device, initial encounter: Secondary | ICD-10-CM

## 2013-05-10 ENCOUNTER — Ambulatory Visit
Admission: RE | Admit: 2013-05-10 | Discharge: 2013-05-10 | Disposition: A | Discharge status: Home / Self Care | Payer: No Typology Code available for payment source | Source: Ambulatory Visit | Attending: Nurse Practitioner | Admitting: Nurse Practitioner

## 2013-05-10 DIAGNOSIS — T8332XA Displacement of intrauterine contraceptive device, initial encounter: Secondary | ICD-10-CM

## 2016-02-11 ENCOUNTER — Ambulatory Visit (INDEPENDENT_AMBULATORY_CARE_PROVIDER_SITE_OTHER): Payer: PRIVATE HEALTH INSURANCE | Admitting: Family Medicine

## 2016-02-11 ENCOUNTER — Encounter: Payer: Self-pay | Admitting: Family Medicine

## 2016-02-11 VITALS — BP 106/67 | HR 79 | Temp 98.8°F | Resp 16 | Ht 61.0 in | Wt 160.0 lb

## 2016-02-11 DIAGNOSIS — I10 Essential (primary) hypertension: Secondary | ICD-10-CM

## 2016-03-04 ENCOUNTER — Ambulatory Visit: Payer: PRIVATE HEALTH INSURANCE | Admitting: Family Medicine

## 2016-05-20 NOTE — Progress Notes (Addendum)
NO show

## 2016-06-06 ENCOUNTER — Telehealth (HOSPITAL_COMMUNITY): Payer: Self-pay | Admitting: Hematology

## 2016-06-06 NOTE — Telephone Encounter (Signed)
Called patient back to follow up to her walk in office question in regards to a bill for DOS 02/11/2016.  After doing research, I see the check in was cancelled.  Patient states she was never seen as she was told we did not take her insurance.  I discussed with provider and had the No show charge code removed.  Called and spoke with Mor[facility]Ashley at [facility] to verify that the charge was removed.  Patient was thankful for my help.

## 2016-11-09 LAB — GLUCOSE, POCT (MANUAL RESULT ENTRY): POC Glucose: 84 mg/dl (ref 70–99)

## 2017-04-05 ENCOUNTER — Encounter: Payer: Self-pay | Admitting: Medical

## 2017-12-25 ENCOUNTER — Encounter (HOSPITAL_COMMUNITY): Payer: Self-pay

## 2017-12-25 ENCOUNTER — Emergency Department (HOSPITAL_COMMUNITY)
Admission: EM | Admit: 2017-12-25 | Discharge: 2017-12-25 | Disposition: A | Discharge status: Home / Self Care | Payer: PRIVATE HEALTH INSURANCE | Attending: Emergency Medicine | Admitting: Emergency Medicine

## 2017-12-25 ENCOUNTER — Other Ambulatory Visit: Payer: Self-pay

## 2017-12-25 ENCOUNTER — Emergency Department (HOSPITAL_COMMUNITY): Payer: PRIVATE HEALTH INSURANCE

## 2017-12-25 DIAGNOSIS — K047 Periapical abscess without sinus: Secondary | ICD-10-CM | POA: Insufficient documentation

## 2017-12-25 MED ORDER — IBUPROFEN 800 MG PO TABS: 800.0000 mg | ORAL_TABLET | Freq: Three times a day (TID) | ORAL | 0 refills | Status: AC

## 2017-12-25 MED ORDER — CLINDAMYCIN HCL 300 MG PO CAPS: 300.0000 mg | ORAL_CAPSULE | Freq: Four times a day (QID) | ORAL | 0 refills | Status: AC

## 2017-12-25 MED ORDER — LIDOCAINE VISCOUS HCL 2 % MT SOLN
15.0000 mL | Freq: Once | OROMUCOSAL | Status: AC
  Administered 2017-12-25: 15 mL via OROMUCOSAL
  Filled 2017-12-25: qty 15

## 2017-12-25 MED ORDER — AMOXICILLIN-POT CLAVULANATE 875-125 MG PO TABS: 1.0000 | ORAL_TABLET | Freq: Two times a day (BID) | ORAL | 0 refills | Status: DC

## 2017-12-25 MED ORDER — METHYLPREDNISOLONE SODIUM SUCC 40 MG IJ SOLR
40.0000 mg | Freq: Once | INTRAMUSCULAR | Status: AC
  Administered 2017-12-25: 40 mg via INTRAMUSCULAR
  Filled 2017-12-25: qty 1

## 2017-12-25 NOTE — ED Notes (Signed)
Bed: WTR5 Expected date:  Expected time:  Means of arrival:  Comments: 

## 2017-12-25 NOTE — ED Provider Notes (Signed)
[facility]-EMERGENCY DEPT Provider Note  CSN: [id number] Arrival date & time: 12/25/17  1053  History   Chief Complaint Chief Complaint  Patient presents with  . Dental Pain  . Facial Swelling    HPI Cynthia Fletcher is a 42 y.o. female with a medical history of GERD, HSV and allergies who presented to the ED for dental pain and facial swelling x3 days. Patient first had right upper tooth pain on Friday 12/22/17 which prompted her to go to the emergency dentist on 12/23/17. She states that she was diagnosed with an abscess and given Rx for Amoxicillin. She reports increased facial swelling after taking the Amoxicillin and no relief in dental pain. She states that when she returned to the dentist today that they were concerned about the facial swelling and advised her to come to the ED. Denies fever, trismus, drooling, difficulty swallowing. Denies eye pain, photophobia, vision changes, facial droop, otalgia or headache.   Additional [facility]. Patient got an x-ray on Saturday 12/23/17 and images were consistent with periapical infection on tooth #4. At that time, patient was advised to get tooth removed which patient refused at that time.   Past Medical History:  Diagnosis Date  . GERD (gastroesophageal reflux disease)     Patient Active Problem List   Diagnosis Date Noted  . Pregnancy, supervision of normal 05/10/2011  . Susceptible to varicella (non-immune), currently pregnant 04/18/2011  . HEMORRHOIDS, EXTERNAL 05/18/2009  . ALLERGIC RHINITIS 05/01/2009  . GENITAL HERPES, HX OF 03/26/2009  . PREVIOUS C-SECT DELIVERY ANTPRTM COND/COMP 01/01/2009  . RH FACTOR, NEGATIVE 12/24/2008    Past Surgical History:  Procedure Laterality Date  . CESAREAN SECTION     x3  . CESAREAN SECTION  07/28/2011   Procedure: CESAREAN SECTION;  Surgeon: Tereso NewcomerUgonna A Anyanwu, MD;  Location: [facility];  Service: Gynecology;  Laterality: N/A;  . CESAREAN SECTION        OB History    Gravida  5   Para  3   Term  3   Preterm      AB  2   Living  3     SAB      TAB      Ectopic      Multiple      Live Births  3            Home Medications    Prior to Admission medications   Medication Sig Start Date End Date Taking? Authorizing Provider  clindamycin (CLEOCIN) 300 MG capsule Take 1 capsule (300 mg total) by mouth 4 (four) times daily for 7 days. 12/25/17 01/01/18  Cressida Milford, Sharyon MedicusGabrielle I, PA-C  hydrocortisone (ANUSOL-HC) 2.5 % rectal cream Place rectally 3 (three) times daily as needed. Patient not taking: Reported on 02/11/2016 04/14/11   Kristen Cardinalaviness, Dawn M, MD  ibuprofen (ADVIL,MOTRIN) 800 MG tablet Take 1 tablet (800 mg total) by mouth 3 (three) times daily. 12/25/17   Romelle Reiley, Jerrel IvoryGabrielle I, PA-C  pantoprazole (PROTONIX) 20 MG tablet Take 1 tablet (20 mg total) by mouth daily. 05/10/11 05/09/12  Kristen Cardinalaviness, Dawn M, MD  Prenatal Multivit-Min-Fe-FA (PRENATAL VITAMINS) 0.8 MG tablet Take 1 tablet by mouth daily. Patient not taking: Reported on 02/11/2016 04/14/11   Kristen Cardinalaviness, Dawn M, MD    Family History Family History  Problem Relation Age of Onset  . Parkinsonism Mother   . Hypertension Father   . Parkinsonism Sister   . Parkinsonism Brother  Social History Social History   Tobacco Use  . Smoking status: Never Smoker  . Smokeless tobacco: Never Used  Substance Use Topics  . Alcohol use: No  . Drug use: No     Allergies   Patient has no known allergies.   Review of Systems Review of Systems  Constitutional: Negative for activity change, appetite change, chills and fever.  HENT: Positive for dental problem and facial swelling. Negative for congestion, drooling, ear pain, postnasal drip, rhinorrhea, sinus pressure, sinus pain, sneezing, sore throat and tinnitus.   Eyes: Negative for photophobia, pain, redness and visual disturbance.  Respiratory: Negative.   Cardiovascular: Negative.   Skin: Negative.      Physical  Exam Updated Vital Signs BP 117/82 (BP Location: Left Arm)   Pulse 89   Temp 99.2 F (37.3 C) (Oral)   Resp 18   Ht 5\' 4"  (1.626 m)   Wt 72.6 kg (160 lb)   LMP 11/27/2017 (Approximate)   SpO2 98%   BMI 27.46 kg/m   Physical Exam  Constitutional: Vital signs are normal. She appears well-developed and well-nourished. She is cooperative. She does not appear ill. No distress.  HENT:  Head: Normocephalic and atraumatic. Head is without right periorbital erythema.  Right Ear: Tympanic membrane, external ear and ear canal normal.  Left Ear: Tympanic membrane, external ear and ear canal normal.  Nose: Right sinus exhibits maxillary sinus tenderness. Right sinus exhibits no frontal sinus tenderness. Left sinus exhibits no maxillary sinus tenderness and no frontal sinus tenderness.  Mouth/Throat: Uvula is midline, oropharynx is clear and moist and mucous membranes are normal. No trismus in the jaw. No dental abscesses or uvula swelling. No posterior oropharyngeal edema or posterior oropharyngeal erythema.    Right periorbital edema. Non-erythematous and nontender to palptation. Tooth #4 tender to palpation where crown is seen. No gingival edema or erythema visualized. ~2-3cm area of fluctuance appreciated on right maxillary region. No trismus. Full ROM of TMJ.  Eyes: Pupils are equal, round, and reactive to light. EOM and lids are normal. Right conjunctiva is not injected. Left conjunctiva is not injected.  No pain with EOMs. Pupils equal sizes bilaterally.  Neurological: She is alert.  Nursing note and vitals reviewed.    ED Treatments / Results  Labs (all labs ordered are listed, but only abnormal results are displayed) Labs Reviewed - No data to display  EKG None  Radiology Ct Maxillofacial Wo Contrast  Result Date: 12/25/2017 CLINICAL DATA:  Right tooth pain.  Right facial swelling EXAM: CT MAXILLOFACIAL WITHOUT CONTRAST TECHNIQUE: Multidetector CT imaging of the maxillofacial  structures was performed. Multiplanar CT image reconstructions were also generated. COMPARISON:  None. FINDINGS: Osseous: Periapical lucency and lateral cortical erosion of the right upper second molar. Given the symptoms, this is consistent with acute Peri apical abscess. No other acute skeletal abnormality. Orbits: Bony orbit normal. Orbital soft tissues normal. Soft tissue swelling of the upper and lower eyelid Sinuses: Mucosal edema right maxillary sinus possibly odontogenic. No air-fluid levels. Remaining sinuses clear Soft tissues: Soft tissue swelling of the right face. Soft tissue swelling of the upper and lower eyelid on the right. Findings compatible with cellulitis. Limited evaluation for subperiosteal abscess without intravenous contrast administration. Limited intracranial: Negative IMPRESSION: Periapical abscess right upper second molar with surrounding soft tissue edema in the face consistent with cellulitis. Limited evaluation for subperiosteal abscess without intravenous contrast. Mucosal edema right maxillary sinus may be odontogenic. Electronically Signed   By: Marlan Palau M.D.  On: 12/25/2017 13:49    Procedures Procedures (including critical care time)  Medications Ordered in ED Medications  lidocaine (XYLOCAINE) 2 % viscous mouth solution 15 mL (15 mLs Mouth/Throat Given 12/25/17 1444)  methylPREDNISolone sodium succinate (SOLU-MEDROL) 40 mg/mL injection 40 mg (40 mg Intramuscular Given 12/25/17 1444)     Initial Impression / Assessment and Plan / ED Course  Triage vital signs and the nursing notes have been reviewed.  Pertinent labs & imaging results that were available during care of the patient were reviewed and considered in medical decision making (see chart for details). Clinical Course as of Dec 25 1500  Mon Dec 25, 2017  1415 Periapical abscess with possible cellulitis seen. No infiltration or involvement of orbits, sinus or bones.   [GM]    Clinical Course User  Index [GM] Rachele Lamaster, Sharyon Medicus, PA-C   Patient presents in no acute distress. She is afebrile and vital signs were normal. Patient denies systemic s/s that would suggest a widespread infection. On physical exam, there is right periorbital and maxillary edema. Periapical abscess confirmed with CT scan.  Fortunately, there is no orbit, bone or sinus infiltration or involvement based on physical exam and imaging findings.   Patient is stable enough to continue PO antibiotic treatment as outpatient and follow-up with oral surgeon. Will switch current antibiotic to Clindamycin for better coverage for abscess and treat cellulitis. Education was provided on dental hygiene and reasons to return to the ED vs dentist (painful eye movements, more facial swelling, fever, etc.)  Final Clinical Impressions(s) / ED Diagnoses  1. Periapical Abscess. Switch antibiotic from Amoxicillin to Clindamycin for better coverage. Advised patient to follow-up with oral surgeon vs dentist to discuss need for I&D. Solumedrol 40mg  x1 given to assist with periorbital swelling. Ibuprofen prescribed for pain. Education provided for OTC and supportive treatment for symptom relief.  Dispo: Home. After thorough clinical evaluation, this patient is determined to be medically stable and can be safely discharged with the previously mentioned treatment and/or outpatient follow-up/referral(s). At this time, there are no other apparent medical conditions that require further screening, evaluation or treatment.  Final diagnoses:  Periapical abscess    ED Discharge Orders        Ordered    amoxicillin-clavulanate (AUGMENTIN) 875-125 MG tablet  2 times daily,   Status:  Discontinued     12/25/17 1414    clindamycin (CLEOCIN) 300 MG capsule  4 times daily     12/25/17 1426    ibuprofen (ADVIL,MOTRIN) 800 MG tablet  3 times daily     12/25/17 1430        Antwanette Wesche, [place] I, PA-C 12/25/17 1502    Rolan Bucco, MD 12/25/17  [phone]

## 2017-12-25 NOTE — ED Triage Notes (Signed)
Patient c/o right upper tooth pain x 3 days. Patient reports facial swelling x 2 days.

## 2017-12-25 NOTE — Discharge Instructions (Signed)
Stop taking the antibiotics prescribed by the dentist and start taking the Clindamycin which I have given you today.   Call Dr. Barbette MerinoJensen, the oral surgeon listed below, to schedule an appointment to discuss further treatment for the abscess.  For pain relief, you may use

## 2019-03-11 IMAGING — CT CT MAXILLOFACIAL W/O CM
3 of 4 series · 15 of 47 positions shown, 18 images · non-contrast
Comparison: None.

CLINICAL DATA: Right tooth pain.  Right facial swelling

EXAM:
CT MAXILLOFACIAL WITHOUT CONTRAST
TECHNIQUE: Multidetector CT imaging of the maxillofacial structures was
performed. Multiplanar CT image reconstructions were also generated.

[Series 2: max soft · axial · 0.33mm/px · z∈[+1425,+1537]mm · 9 of 66 slices shown, 12 images]
[im 5/66  brain]
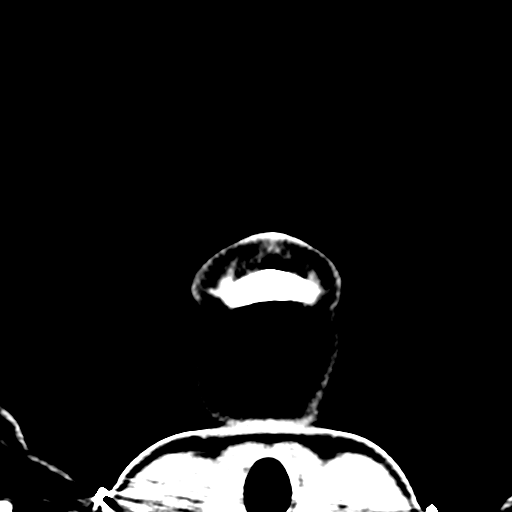
[im 5/66  bone]
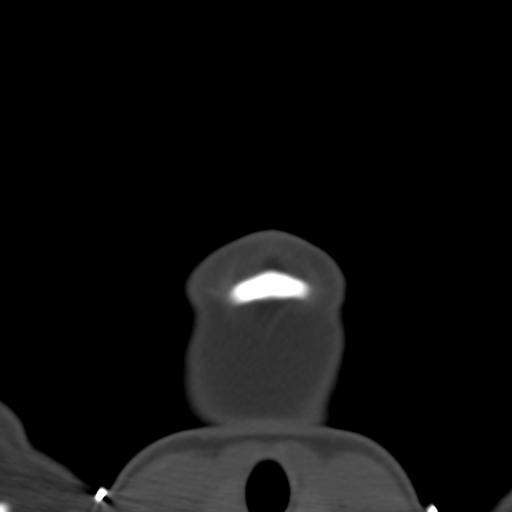
[im 12/66  bone]
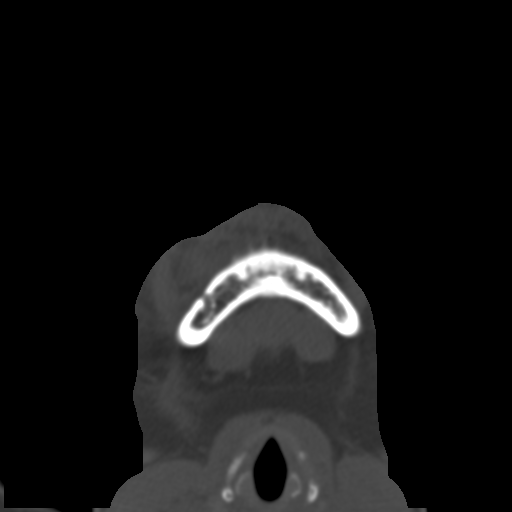
[im 18/66  bone]
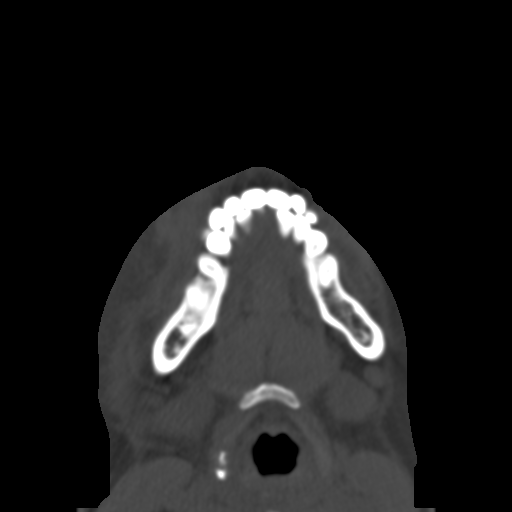
[im 25/66  bone]
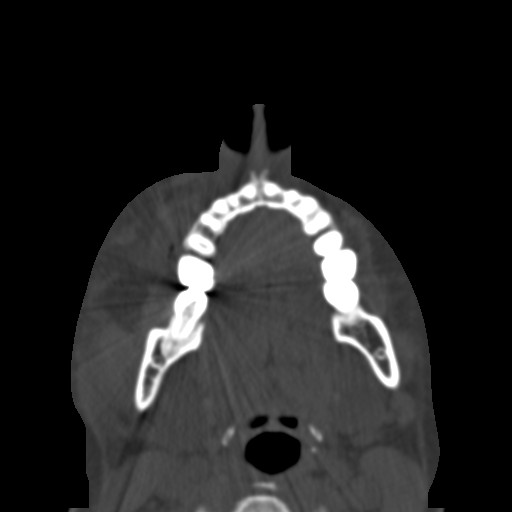
[im 34/66  brain]
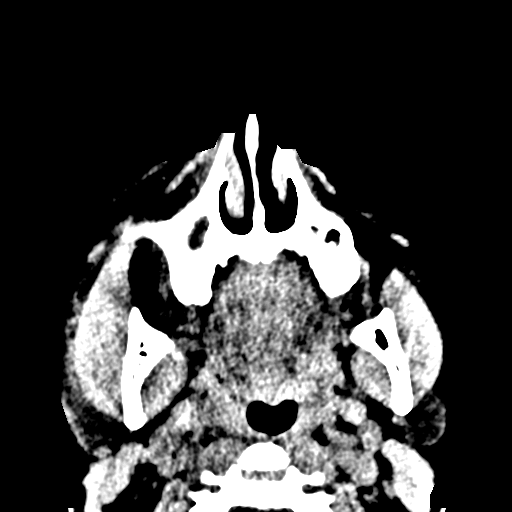
[im 34/66  bone]
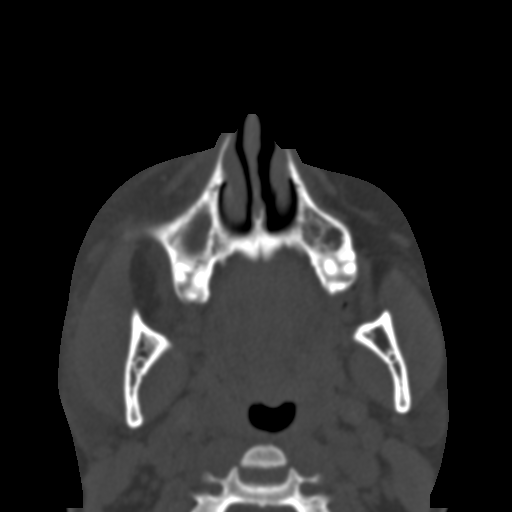
[im 41/66  bone]
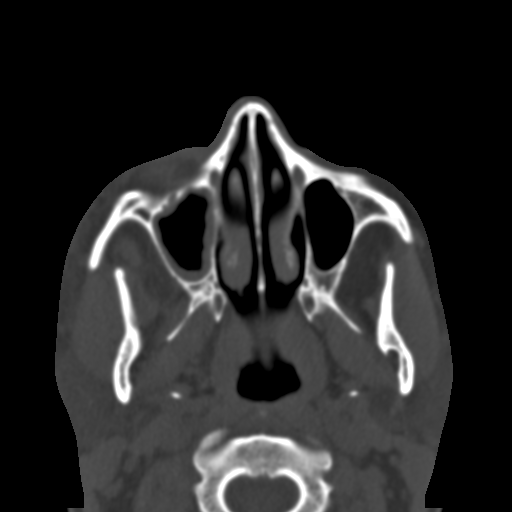
[im 48/66  bone]
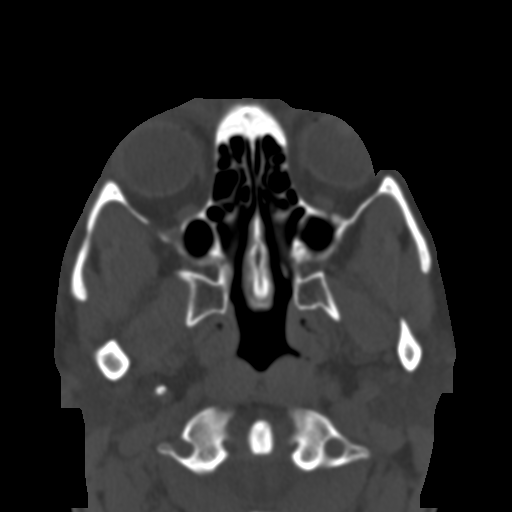
[im 54/66  bone]
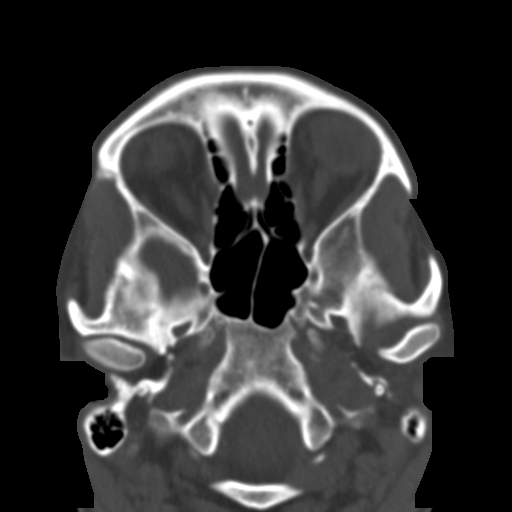
[im 61/66  brain]
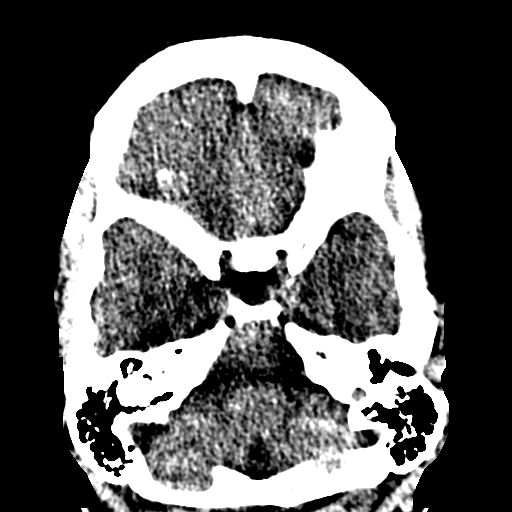
[im 61/66  bone]
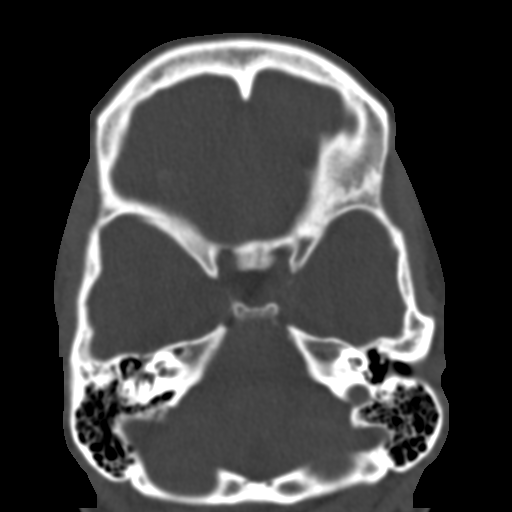

[Series 7: sagittal soft · sagittal · 0.30mm/px · 3 of 73 slices shown]
[im 25/73  bone]
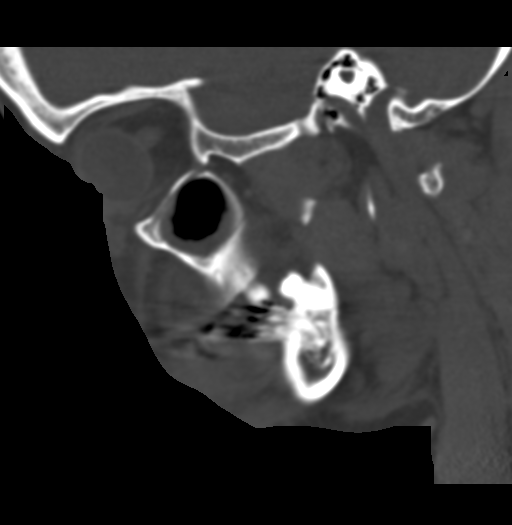
[im 37/73  bone]
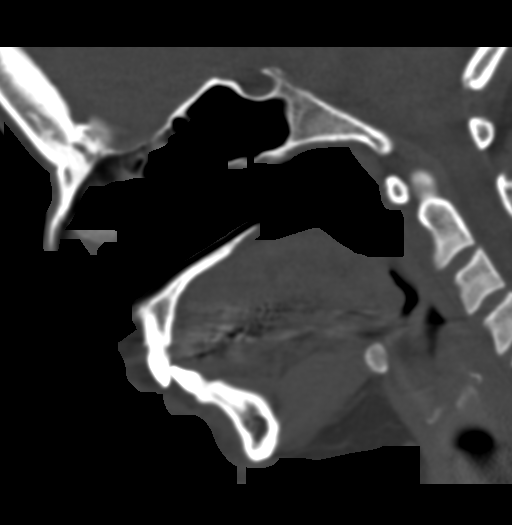
[im 49/73  bone]
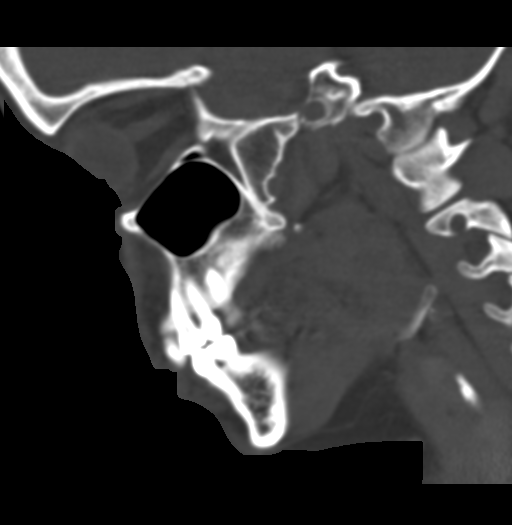

[Series 8: coronal bone · coronal · 0.32mm/px · 3 of 68 slices shown]
[im 17/68  bone]
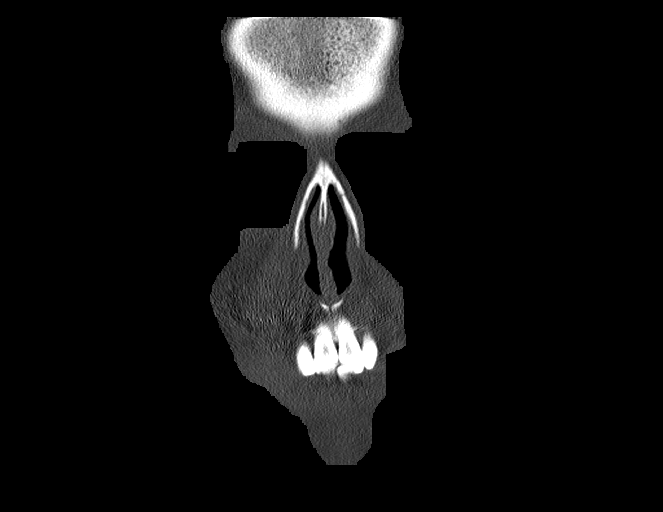
[im 34/68  bone]
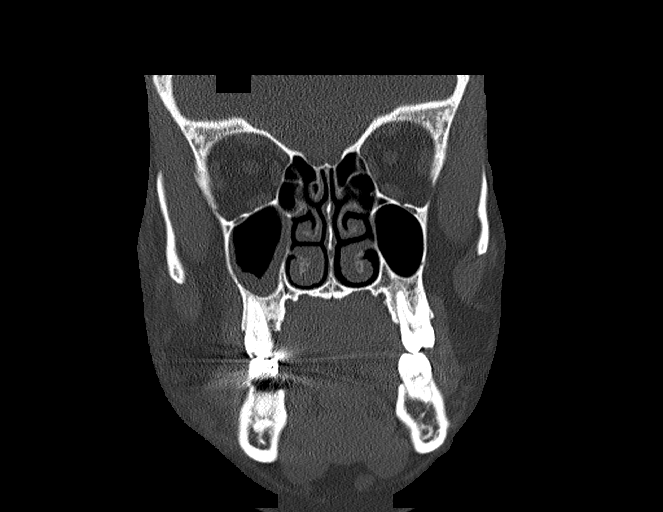
[im 51/68  bone]
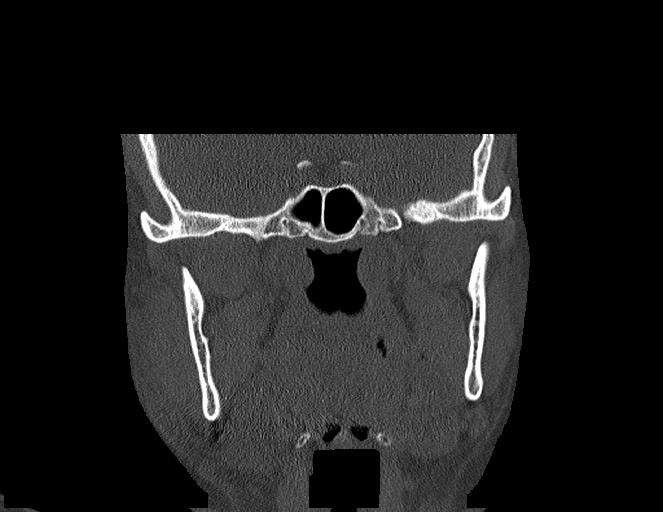

[15 of 47 positions shown; findings below may reference images not displayed]

FINDINGS: Osseous: Periapical lucency and lateral cortical erosion of the
right upper second molar. Given the symptoms, this is consistent
with acute Peri apical abscess.

No other acute skeletal abnormality.

Orbits: Bony orbit normal. Orbital soft tissues normal. Soft tissue
swelling of the upper and lower eyelid

Sinuses: Mucosal edema right maxillary sinus possibly odontogenic.
No air-fluid levels. Remaining sinuses clear

Soft tissues: Soft tissue swelling of the right face. Soft tissue
swelling of the upper and lower eyelid on the right. Findings
compatible with cellulitis. Limited evaluation for subperiosteal
abscess without intravenous contrast administration.

Limited intracranial: Negative
IMPRESSION: Periapical abscess right upper second molar with surrounding soft
tissue edema in the face consistent with cellulitis. Limited
evaluation for subperiosteal abscess without intravenous contrast.

Mucosal edema right maxillary sinus may be odontogenic.

## 2021-10-25 ENCOUNTER — Ambulatory Visit: Payer: Self-pay | Admitting: General Surgery

## 2021-10-25 NOTE — H&P (Signed)
?  ?  ?REFERRING PHYSICIAN:  None ?  ?PROVIDER:  Monico Blitz, MD ?  ?MRN: [id number] ?DOB: 24-Jul-1975 ?DATE OF ENCOUNTER: 10/25/2021 ?  ?Subjective  ?  ?Chief Complaint: Hemorrhoids ?  ?  ?  ?History of Present Illness: ?Cynthia Fletcher is a 46 y.o. female who is seen today as an office consultation for hemorrhoids.  She states she has occasional flares of swelling and pain.  She notices some blood on the toilet paper during these flares.  She reports regular bowel habits and denies any straining.  She denies any constipation issues. ?  ?  ?Review of Systems: ?A complete review of systems was obtained from the patient.  I have reviewed this information and discussed as appropriate with the patient.  See HPI as well for other ROS. ?  ?  ?Medical History: ?Past Medical History  ?History reviewed. No pertinent past medical history.  ? ?  ?There is no problem list on file for this patient. ?  ?  ?Past Surgical History  ?     ?Past Surgical History:  ?Procedure Laterality Date  ? CESAREAN SECTION      ?  ?  ?  ?Allergies  ?No Known Allergies  ? ?  ?No current outpatient medications on file prior to visit.  ?  ?No current facility-administered medications on file prior to visit.  ?  ?  ?Family History  ?History reviewed. No pertinent family history.  ?  ?  ?Social History  ?  ?   ?Tobacco Use  ?Smoking Status Never  ?Smokeless Tobacco Never  ?  ?  ?Social History  ?Social History  ?  ?    ?Socioeconomic History  ? Marital status: Married  ?Tobacco Use  ? Smoking status: Never  ? Smokeless tobacco: Never  ?Vaping Use  ? Vaping Use: Never used  ?Substance and Sexual Activity  ? Alcohol use: Never  ? Drug use: Never  ?  ?  ?  ?Objective:  ?  ?  ?   ?Vitals:  ?  10/25/21 1417  ?BP: 114/62  ?Pulse: 79  ?Temp: 36.8 ?C (98.2 ?F)  ?SpO2: 98%  ?Weight: 64.6 kg (142 lb 8 oz)  ?Height: 154.9 cm (5\' 1" )  ?  ?  ?Exam ?Gen: NAD ?Abd: soft ?CV: RRR ?Lungs: CTA ?Rectal: Right anterior skin tag with signs of possible chronic  inflammation versus dysplasia ?  ?Labs, Imaging and Diagnostic Testing: ?  ?Procedure: Anoscopy ?Surgeon: Marcello Moores ?After the risks and benefits were explained, written consent was obtained for above procedure.  A medical assistant chaperone was present thoroughout the entire procedure.  ?Anesthesia: none ?Diagnosis: hemorrhoids ?Findings: Patient with a small anterior skin tag and a moderate-sized right anterior and left lateral skin tags.  The right anterior skin tag has a mass which may be associated with chronic inflammation versus dysplasia.  Grade 1 right anterior and right posterior hemorrhoids.  Thrombosed left lateral internal hemorrhoid. ?  ?Assessment and Plan:  ?Diagnoses and all orders for this visit: ?  ?Anal lesion ?  ?  ?Patient here to discuss possible banding of her hemorrhoids.  We discussed that rubber band ligation would not be possible to external hemorrhoids.  We discussed the patient's right anterior mass as concerning for a cancerous or precancerous lesion.  I recommended excision of the skin tag.  We discussed typical postoperative pain and healing times.  I did not recommend excising all 3 skin tags due to significant pain associated with this. ?  ?  No follow-ups on file. ? ?Rosario Adie, MD ? ?Colorectal and General Surgery ?[facility]  ?
